# Patient Record
Sex: Female | Born: 1990 | Race: White | Hispanic: Yes | Marital: Single | Smoking: Current every day smoker
Health system: Southern US, Community
[De-identification: ages and names within clinical notes are randomized; demographics above are authoritative.]

## PROBLEM LIST (undated history)

## (undated) DIAGNOSIS — F1120 Opioid dependence, uncomplicated: Secondary | ICD-10-CM

## (undated) DIAGNOSIS — K219 Gastro-esophageal reflux disease without esophagitis: Secondary | ICD-10-CM

## (undated) DIAGNOSIS — Z87442 Personal history of urinary calculi: Secondary | ICD-10-CM

## (undated) HISTORY — PX: HX TONSILLECTOMY: SHX27

## (undated) HISTORY — PX: HX GALL BLADDER SURGERY/CHOLE: SHX55

## (undated) HISTORY — DX: Gastro-esophageal reflux disease without esophagitis: K21.9

## (undated) HISTORY — PX: WISDOM TOOTH EXTRACTION: SHX21

---
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
## 2012-05-27 ENCOUNTER — Encounter (HOSPITAL_COMMUNITY): Payer: Self-pay

## 2012-05-27 DIAGNOSIS — H6690 Otitis media, unspecified, unspecified ear: Secondary | ICD-10-CM | POA: Insufficient documentation

## 2012-05-27 DIAGNOSIS — F17200 Nicotine dependence, unspecified, uncomplicated: Secondary | ICD-10-CM | POA: Insufficient documentation

## 2012-05-27 DIAGNOSIS — J069 Acute upper respiratory infection, unspecified: Secondary | ICD-10-CM | POA: Insufficient documentation

## 2012-05-27 NOTE — ED Notes (Signed)
Pt also requesting to have a blood pregnancy test, pt reports taking several at home urine pregnancy test all w/a negative result, pt reports she received her last depo injection March 2013, denies menstrual cycle since stopping the Depo, pt reports she is sexually active, denies using any birth control

## 2012-05-27 NOTE — ED Notes (Signed)
Pt reports nasal/chest congestion, (L) ear pain, dry cough x4 days

## 2012-05-28 ENCOUNTER — Emergency Department (HOSPITAL_COMMUNITY)
Admission: EM | Admit: 2012-05-28 | Discharge: 2012-05-28 | Disposition: A | Discharge status: Home Health | Payer: Medicaid Other | Attending: Emergency Medicine | Admitting: Emergency Medicine

## 2012-05-28 DIAGNOSIS — J069 Acute upper respiratory infection, unspecified: Secondary | ICD-10-CM

## 2012-05-28 DIAGNOSIS — H6692 Otitis media, unspecified, left ear: Secondary | ICD-10-CM

## 2012-05-28 MED ORDER — AZITHROMYCIN 250 MG PO TABS: 250.0000 mg | ORAL_TABLET | Freq: Every day | ORAL | Status: AC

## 2012-05-28 MED ORDER — OXYMETAZOLINE HCL 0.05 % NA SOLN: 2.0000 | Freq: Two times a day (BID) | NASAL | Status: AC

## 2012-05-28 MED ORDER — AZITHROMYCIN 250 MG PO TABS
500.0000 mg | ORAL_TABLET | Freq: Once | ORAL | Status: AC
  Administered 2012-05-28: 500 mg via ORAL
  Filled 2012-05-28: qty 2

## 2012-05-28 MED ORDER — BENZONATATE 100 MG PO CAPS: 200.0000 mg | ORAL_CAPSULE | Freq: Two times a day (BID) | ORAL | Status: DC | PRN

## 2012-05-28 MED ORDER — BENZONATATE 100 MG PO CAPS: 100.0000 mg | ORAL_CAPSULE | Freq: Three times a day (TID) | ORAL | Status: AC

## 2012-05-28 MED ORDER — OXYMETAZOLINE HCL 0.05 % NA SOLN
1.0000 | Freq: Once | NASAL | Status: AC
  Administered 2012-05-28: 1 via NASAL
  Filled 2012-05-28: qty 15

## 2012-05-28 NOTE — ED Notes (Signed)
Pt and s.o. At bedside. Pt states she is feeling better. Instructions for nasal spray explained and verbalized by patient

## 2012-05-28 NOTE — ED Provider Notes (Signed)
History     CSN: [id number]  Arrival date & time 05/27/12  2254   First MD Initiated Contact with Patient 05/28/12 [phone]      Chief Complaint  Patient presents with  . Nasal Congestion    (Consider location/radiation/quality/duration/timing/severity/associated sxs/prior treatment) HPI 21 year old female presents to emergency room complaining of 4 days of nasal congestion, sinus pressure, dry cough, and left ear pain. She denies any fever. No sick contacts, she's been taking over-the-counter NyQuil without improvement in symptoms. She reports she's had some drainage and swelling of her left ear canal. She has not had any recent water exposure. No prior history of otitis externa. Patient is a 1 pack-a-day smoker.  History reviewed. No pertinent past medical history.  History reviewed. No pertinent past surgical history.  History reviewed. No pertinent family history.  History  Substance Use Topics  . Smoking status: Current Everyday Smoker -- 0.5 packs/day    Types: Cigarettes  . Smokeless tobacco: Not on file  . Alcohol Use: Yes     socially    OB History    Grav Para Term Preterm Abortions TAB SAB Ect Mult Living                  Review of Systems  All other systems reviewed and are negative.    Allergies  Amoxicillin and Penicillins  Home Medications   Current Outpatient Rx  Name Route Sig Dispense Refill  . IBUPROFEN 200 MG PO TABS Oral Take 800 mg by mouth every 6 (six) hours as needed. For pain    . AZITHROMYCIN 250 MG PO TABS Oral Take 1 tablet (250 mg total) by mouth daily. 4 tablet 0  . BENZONATATE 100 MG PO CAPS Oral Take 1 capsule (100 mg total) by mouth every 8 (eight) hours. 21 capsule 0  . OXYMETAZOLINE HCL 0.05 % NA SOLN Nasal Place 2 sprays into the nose 2 (two) times daily. Stop using after 3 days 30 mL 0    BP 121/87  Pulse 87  Temp 98.5 F (36.9 C) (Oral)  Resp 20  SpO2 100%  LMP 10/28/2011  Physical Exam  Nursing note and vitals  reviewed. Constitutional: She is oriented to person, place, and time. She appears well-developed and well-nourished.       Patient noted to have multiple piercings and tattoos  HENT:  Head: Normocephalic and atraumatic.  Right Ear: External ear normal.  Mouth/Throat: Oropharynx is clear and moist.       Left TM noted to be bulging, dull with serous exudate. Nasal congestion noted  Eyes: Conjunctivae and EOM are normal. Pupils are equal, round, and reactive to light.  Neck: Normal range of motion. Neck supple. No JVD present. No tracheal deviation present. No thyromegaly present.  Cardiovascular: Normal rate, regular rhythm, normal heart sounds and intact distal pulses.  Exam reveals no gallop and no friction rub.   No murmur heard. Pulmonary/Chest: Effort normal and breath sounds normal. No stridor. No respiratory distress. She has no wheezes. She has no rales. She exhibits no tenderness.  Abdominal: Soft. Bowel sounds are normal. She exhibits no distension and no mass. There is no tenderness. There is no rebound and no guarding.  Musculoskeletal: Normal range of motion. She exhibits no edema and no tenderness.  Lymphadenopathy:    She has no cervical adenopathy.  Neurological: She is alert and oriented to person, place, and time. She exhibits normal muscle tone. Coordination normal.  Skin: Skin is warm and dry. No  rash noted. No erythema. No pallor.  Psychiatric: She has a normal mood and affect. Her behavior is normal. Judgment and thought content normal.    ED Course  Procedures (including critical care time)  Labs Reviewed - No data to display No results found.   1. Otitis media of left ear   2. Upper respiratory infection       MDM  21 year old female with URI and left otitis. Patient reports bradycardia with amoxicillin and penicillin as a child. Will do azithromycin for treatment of her infected ear. He should also given symptomatic treatment for her URI  symptoms.        Olivia Mackie, MD 05/28/12 [phone]

## 2012-10-21 ENCOUNTER — Emergency Department: Payer: Self-pay | Admitting: Internal Medicine

## 2013-02-13 ENCOUNTER — Emergency Department: Payer: Self-pay | Admitting: Emergency Medicine

## 2013-02-13 LAB — DRUG SCREEN, URINE
Barbiturates, Ur Screen: NEGATIVE (ref ?–200)
Benzodiazepine, Ur Scrn: NEGATIVE (ref ?–200)
Cannabinoid 50 Ng, Ur ~~LOC~~: NEGATIVE (ref ?–50)
Cocaine Metabolite,Ur ~~LOC~~: NEGATIVE (ref ?–300)
MDMA (Ecstasy)Ur Screen: NEGATIVE (ref ?–500)
Methadone, Ur Screen: NEGATIVE (ref ?–300)
Opiate, Ur Screen: NEGATIVE (ref ?–300)
Tricyclic, Ur Screen: NEGATIVE (ref ?–1000)

## 2013-02-13 LAB — URINALYSIS, COMPLETE
Bacteria: NONE SEEN
Bilirubin,UR: NEGATIVE
Glucose,UR: NEGATIVE mg/dL (ref 0–75)
Ketone: NEGATIVE
Leukocyte Esterase: NEGATIVE
Nitrite: NEGATIVE
Ph: 7 (ref 4.5–8.0)
Protein: NEGATIVE
RBC,UR: 2 /HPF (ref 0–5)
Specific Gravity: 1.005 (ref 1.003–1.030)
Squamous Epithelial: 1

## 2013-02-13 LAB — CBC
HCT: 38.4 % (ref 35.0–47.0)
HGB: 13.1 g/dL (ref 12.0–16.0)
MCH: 31.2 pg (ref 26.0–34.0)
MCHC: 34 g/dL (ref 32.0–36.0)
Platelet: 232 10*3/uL (ref 150–440)
RDW: 13.2 % (ref 11.5–14.5)
WBC: 12.6 10*3/uL — ABNORMAL HIGH (ref 3.6–11.0)

## 2013-02-13 LAB — ETHANOL: Ethanol: <3 mg/dL

## 2013-02-13 LAB — COMPREHENSIVE METABOLIC PANEL
Albumin: 4.3 g/dL (ref 3.4–5.0)
Calcium, Total: 9.1 mg/dL (ref 8.5–10.1)
Co2: 26 mmol/L (ref 21–32)
EGFR (African American): >60
Glucose: 77 mg/dL (ref 65–99)
Osmolality: 274 (ref 275–301)
SGPT (ALT): 18 U/L (ref 12–78)
Sodium: 139 mmol/L (ref 136–145)
Total Protein: 7.9 g/dL (ref 6.4–8.2)

## 2013-02-14 LAB — HCG, QUANTITATIVE, PREGNANCY: Beta Hcg, Quant.: 724 m[IU]/mL — ABNORMAL HIGH

## 2013-02-14 LAB — PREGNANCY, URINE: Pregnancy Test, Urine: POSITIVE m[IU]/mL

## 2013-04-11 ENCOUNTER — Encounter: Payer: Self-pay | Admitting: Obstetrics & Gynecology

## 2013-05-16 ENCOUNTER — Encounter: Payer: Self-pay | Admitting: Obstetrics and Gynecology

## 2013-05-30 ENCOUNTER — Emergency Department (HOSPITAL_COMMUNITY): Payer: Medicaid Other

## 2013-05-30 ENCOUNTER — Emergency Department (HOSPITAL_COMMUNITY)
Admission: EM | Admit: 2013-05-30 | Discharge: 2013-05-30 | Disposition: A | Discharge status: Home / Self Care | Payer: Medicaid Other | Attending: Emergency Medicine | Admitting: Emergency Medicine

## 2013-05-30 ENCOUNTER — Encounter (HOSPITAL_COMMUNITY): Payer: Self-pay | Admitting: Emergency Medicine

## 2013-05-30 DIAGNOSIS — Y92410 Unspecified street and highway as the place of occurrence of the external cause: Secondary | ICD-10-CM | POA: Insufficient documentation

## 2013-05-30 DIAGNOSIS — O99330 Smoking (tobacco) complicating pregnancy, unspecified trimester: Secondary | ICD-10-CM | POA: Insufficient documentation

## 2013-05-30 DIAGNOSIS — O9989 Other specified diseases and conditions complicating pregnancy, childbirth and the puerperium: Secondary | ICD-10-CM | POA: Insufficient documentation

## 2013-05-30 DIAGNOSIS — Z880 Allergy status to penicillin: Secondary | ICD-10-CM | POA: Insufficient documentation

## 2013-05-30 DIAGNOSIS — Z79899 Other long term (current) drug therapy: Secondary | ICD-10-CM | POA: Insufficient documentation

## 2013-05-30 DIAGNOSIS — Y9389 Activity, other specified: Secondary | ICD-10-CM | POA: Insufficient documentation

## 2013-05-30 LAB — URINALYSIS, ROUTINE W REFLEX MICROSCOPIC
Glucose, UA: NEGATIVE mg/dL
Nitrite: NEGATIVE
Specific Gravity, Urine: 1.016 (ref 1.005–1.030)
pH: 8.5 — ABNORMAL HIGH (ref 5.0–8.0)

## 2013-05-30 LAB — URINE MICROSCOPIC-ADD ON
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
## 2013-05-30 NOTE — Progress Notes (Signed)
Talked with Dr. Debroah Loop. Pt cleared obstetrically.

## 2013-06-01 LAB — URINE CULTURE: Colony Count: >=100000

## 2013-06-02 ENCOUNTER — Encounter: Payer: Self-pay | Admitting: Obstetrics & Gynecology

## 2013-06-02 NOTE — ED Provider Notes (Signed)
Medical screening examination/treatment/procedure(s) were performed by non-physician practitioner and as supervising physician I was immediately available for consultation/collaboration.   Laray Anger, DO 06/02/13 [id number]

## 2013-09-01 ENCOUNTER — Encounter: Payer: Self-pay | Admitting: Maternal and Fetal Medicine

## 2013-09-02 ENCOUNTER — Observation Stay: Payer: Self-pay

## 2013-09-02 LAB — URINALYSIS, COMPLETE
Bilirubin,UR: NEGATIVE
Blood: NEGATIVE
Leukocyte Esterase: NEGATIVE
Nitrite: NEGATIVE
Ph: 7 (ref 4.5–8.0)
Specific Gravity: 1.015 (ref 1.003–1.030)
WBC UR: 1 /HPF (ref 0–5)

## 2013-10-07 ENCOUNTER — Observation Stay: Payer: Self-pay | Admitting: Obstetrics and Gynecology

## 2013-10-09 ENCOUNTER — Inpatient Hospital Stay: Payer: Self-pay

## 2013-10-09 LAB — CBC WITH DIFFERENTIAL/PLATELET
BASOS ABS: 0.2 10*3/uL — AB (ref 0.0–0.1)
BASOS PCT: 0.9 %
EOS PCT: 0.2 %
Eosinophil #: 0 10*3/uL (ref 0.0–0.7)
HCT: 35.2 % (ref 35.0–47.0)
HGB: 12.1 g/dL (ref 12.0–16.0)
Lymphocyte #: 2.2 10*3/uL (ref 1.0–3.6)
Lymphocyte %: 11 %
MCH: 32.5 pg (ref 26.0–34.0)
MCHC: 34.4 g/dL (ref 32.0–36.0)
MCV: 94 fL (ref 80–100)
Monocyte #: 1 x10 3/mm — ABNORMAL HIGH (ref 0.2–0.9)
Monocyte %: 5 %
NEUTROS PCT: 82.9 %
Neutrophil #: 16.3 10*3/uL — ABNORMAL HIGH (ref 1.4–6.5)
PLATELETS: 223 10*3/uL (ref 150–440)
RBC: 3.74 10*6/uL — ABNORMAL LOW (ref 3.80–5.20)
RDW: 13.8 % (ref 11.5–14.5)
WBC: 19.7 10*3/uL — ABNORMAL HIGH (ref 3.6–11.0)

## 2013-10-09 LAB — GC/CHLAMYDIA PROBE AMP

## 2013-10-10 LAB — URINALYSIS, COMPLETE
Bacteria: NONE SEEN
Bilirubin,UR: NEGATIVE
GLUCOSE, UR: NEGATIVE mg/dL (ref 0–75)
Ketone: NEGATIVE
Nitrite: NEGATIVE
Ph: 5 (ref 4.5–8.0)
RBC,UR: 195 /HPF (ref 0–5)
Specific Gravity: 1.026 (ref 1.003–1.030)
WBC UR: 49 /HPF (ref 0–5)

## 2013-10-10 LAB — HEMATOCRIT: HCT: 31.7 % — ABNORMAL LOW (ref 35.0–47.0)

## 2013-10-12 LAB — URINE CULTURE

## 2014-06-06 ENCOUNTER — Other Ambulatory Visit (HOSPITAL_COMMUNITY): Payer: Self-pay | Admitting: Emergency Medicine

## 2014-07-24 ENCOUNTER — Encounter (HOSPITAL_COMMUNITY): Payer: Self-pay | Admitting: Emergency Medicine
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
## 2015-01-13 NOTE — Consult Note (Signed)
Brief Consult Note: Diagnosis: Major depressive disorder recurrent moderate.   Patient was seen by consultant.   Consult note dictated.   Recommend further assessment or treatment.   Orders entered.   Comments: Selena Nelson has a h/o depression. She has been off medications throughout pregnancy. She is ready to restart antidepressant.   PLAN: 1. Will start Lexapro that was helpful in the past.  2. Pt was given information about [facility], local MH provider.  3. I will follow up tomorrow.  Electronic Signatures: Kristine LineaPucilowska, Sharnese Heath (MD)  (Signed 19-Jan-15 18:20)  Authored: Brief Consult Note   Last Updated: 19-Jan-15 18:20 by Kristine LineaPucilowska, Melinna Linarez (MD)

## 2015-01-13 NOTE — Consult Note (Signed)
Brief Consult Note: Diagnosis: Major depressive disorder recurrent moderate.   Patient was seen by consultant.   Consult note dictated.   Recommend further assessment or treatment.   Orders entered.   Comments: [name] has a h/o depression. She has been off medications throughout pregnancy. She was started on lexapro last night and seems to tolerate it well.   MSE: Alert, oriented, pleasant, no safety issues, no psychosis, father in the room will stay in the area for the next two weeks.    PLAN: 1. Will continue Lexapro 10 mg daily.   2. Pt was given information about [facility], local MH provider.  Electronic Signatures: Kristine LineaPucilowska, Cera Rorke (MD)  (Signed 20-Jan-15 21:54)  Authored: Brief Consult Note   Last Updated: 20-Jan-15 21:54 by Kristine LineaPucilowska, Cortasia Screws (MD)

## 2015-01-14 NOTE — Discharge Summary (Signed)
PATIENT NAME:  Selena QuailsCRISSEY [name], Maebell MR#:  [id number] DATE OF BIRTH:  [birth year]  DATE OF ADMISSION:  11/10/2011 DATE OF DISCHARGE:  11/11/2011  BRIEF HISTORY: Ms. Anson OregonVirola is a 24 year old girl seen in the Emergency Room with abdominal pain for about 8 to 12 hours prior to admission. She had been having mild nausea and anorexia for several days. Her symptoms worsened early on the morning of the 18th, and she presented to the Emergency Room for further evaluation. White blood cell count was unremarkable at 9200. Platelet count was down to 144,000. Electrolytes were unremarkable. Alkaline phosphatase was slightly depressed at 54. Ultrasound was performed which revealed a possible tubular structure in the right adnexa consistent with acute appendicitis, but CT scan with contrast did not demonstrate any evidence of inflammatory change or swollen appendix consistent with appendicitis. Her clinical presentation did not support appendicitis.   HOSPITAL COURSE: She was admitted to the hospital, and we placed her on IV antibiotics as she did have a history of urinary tract infection and IV rehydration. Symptoms improved over the last 24 hours. She is no longer having any significant abdominal pain, and her nausea is better. She is voiding spontaneously. We will discharge her home with Percocet for pain and to continue the previously prescribed antibiotics from [facility]. We will see her back in our office or have her [facility].   Of note is the fact that her white blood cell count is 3000 today, and her platelet count is down to 90,000. It is possible that this illness is somewhat related to her hematologic abnormalities, and we will need to follow up with a repeat laboratory value in 10 days or two weeks. I discussed this plan with her in detail and strongly encouraged her to follow up with her primary care doctor or with our office in order to review those  laboratory values. She is in agreement with this plan.  ____________________________ Carmie Endalph L. Ely III, MD rle:cbb D: 11/11/2011 12:05:19 ET T: 11/11/2011 13:05:21 ET JOB#: [id number]  cc: Carmie Endalph L. Ely III, MD, <Dictator> [facility] Ginette A. Andrey SpearmanArchinal, MD Quentin OreALPH L ELY MD ELECTRONICALLY SIGNED 11/12/2011 9:58

## 2015-01-14 NOTE — H&P (Signed)
PATIENT NAME:  Selena Nelson, Selena Nelson MR#:  [id number] DATE OF BIRTH:  December 09, 1990  DATE OF ADMISSION:  11/10/2011  PRIMARY CARE PHYSICIAN: [facility].   ADMITTING PHYSICIAN: Tiney Rougealph Ely, MD  CHIEF COMPLAINT: Abdominal pain.   BRIEF HISTORY: Selena Nelson is a 24 year old girl seen in the Emergency Room with about an 8 to 10 hour history of severe bilateral lower quadrant abdominal pain. She has been having increased urinary frequency and dysuria over the last several days and went to an urgent care center yesterday where she was diagnosed as having a urinary tract infection. She was given a prescription for antibiotics, but she did not get an opportunity to take those. She has had some anorexia and appetite changes, but no true nausea. She has had a couple episode of chills. She has had no fever that she is aware of. She denies any diarrhea or constipation. She awoke in the middle of the night feeling as though she had to void and then suffered severe bilateral lower quadrant abdominal pain which was unrelenting. She was transported to the Emergency Room by EMS and evaluated in the emergency department.   Work-up demonstrated normal laboratory values with normal white blood cell count. She did have evidence of urinary tract infection with white cells and red cells in her urine. Ultrasound was performed which revealed a vague tubular structure in the right lower quadrant and right pelvis suggestive of possible appendicitis. CT scan with contrast was performed which demonstrated a normal size appendix without evidence of any inflammatory stranding. There was some vague fluid in the right adnexa and in the right pelvis. The surgical service was consulted.   She denies any previous similar problems. She denies any other significant GI problems including hepatitis, yellow jaundice, pancreatitis, peptic ulcer disease, diverticulitis, or gallbladder disease. She denied any significant GYN problems, although as  a [age] year old she was told she had possible endometriosis. She has not had any recent abnormal bleeding. She denies any history of cardiac disease, hypertension, or diabetes.   MEDICATIONS: She takes no medications regularly.   ALLERGIES: Penicillin and amoxicillin with bradycardia as the symptom produced.   SOCIAL HISTORY: She is a cigarette smoker smoking approximately a pack of cigarettes a day and she drinks alcohol socially. She takes no illicit drugs.   FAMILY HISTORY: Noncontributory.   REVIEW OF SYSTEMS: No significant visual or hearing disturbances. She denies any headache. She denies any neck immobility or pain. She denies any shortness of breath or dyspnea on exertion. She denies any chest pain or heart rhythm disturbances. GU and GI symptoms are noted above. She denies any psychiatric symptoms. She denies any neurologic symptoms. She denies any musculoskeletal problems, including joint discomfort or pain.   PHYSICAL EXAMINATION:  GENERAL: She is an alert young woman a bit frustrated with the proceedings over the last several hours.  VITALS: Blood pressure is 110/72, heart rate 84 and regular, and she is afebrile.   HEENT: She has multiple facial piercings. She does not have any scleral icterus. Her pupils are equally round. She has no facial deformity.   NECK: Supple without adenopathy or tenderness, and her trachea is midline.   LUNGS: Chest is clear with no adventitious sounds and she has normal pulmonary excursion.   HEART: Normal sinus rhythm with no murmurs or gallops to my ear.   ABDOMEN: Generally soft with no point tenderness. She has bilateral lower quadrant tenderness on deep palpation, but no rebound and no guarding.  She has hypoactive but present bowel sounds. No hernias are noted. She has no masses or jiggle  GI  tenderness.   EXTREMITIES: Lower extremity exam reveals full range of motion, no deformities, and normal distal pulses.   PSYCHIATRIC: Normal  orientation and affect.   IMPRESSION AND PLAN: I have independently reviewed her CT scan. Clinical examination does not suggest appendicitis. I do not have a specific diagnosis at the present time. I recommend we admit her to the hospital under observation because of her significant pain, treat her with pain medications, start her on antibiotics for the urinary tract infection, and observe her with serial examinations. This plan has been discussed with the patient and her family and they are in agreement.  ____________________________ Carmie End, MD rle:slb D: 11/10/2011 12:05:44 ET T: 11/10/2011 12:30:41 ET JOB#: [id number]  cc: Carmie End, MD, <Dictator> [facility] Quentin Ore MD ELECTRONICALLY SIGNED 11/10/2011 17:30

## 2015-01-30 NOTE — H&P (Signed)
L&D Evaluation:  History:  HPI 24yo G1P0 with LMP ?4/14 & EDD of 10/21/13 [facility] significant for Depression, smoker 1 ppd now 4 cigs a day, RH neg, Varicella non-immune, Bilat choroid plexus cysts. Pt [facility] with active labor UC's. AROM performed at 0933   for clear fluid. FHR reassuring.   Presents with contractions   Patient's Medical History UTI,inflamed appendix,   Patient's Surgical History Tonsillectomy   Medications Pre Natal Vitamins   Allergies Kiwi causes tongue to swell, PCN causes SOB, Abilify causes neuro problem   Social History tobacco  EtOH  Last ETOH mid 01/2013 x 2 yrs   Family History Non-Contributory   ROS:  ROS All systems were reviewed.  HEENT, CNS, GI, GU, Respiratory, CV, Renal and Musculoskeletal systems were found to be normal.   Exam:  Vital Signs stable   General no apparent distress   Mental Status clear   Chest clear   Heart normal sinus rhythm, no murmur/gallop/rubs   Abdomen gravid, tender with contractions   Estimated Fetal Weight Average for gestational age   Fetal Position vtx   Back no CVAT   Edema 1+   Reflexes 1+   Pelvic 6-7/100/vtx0   Mebranes Intact   Description clear   FHT normal rate with no decels, reactive NST with 2 accels 15 x 15   Ucx regular   Skin dry   Lymph no lymphadenopathy   Impression:  Impression active labor, IUP at term with active labor   Plan:  Plan monitor contractions and for cervical change   Electronic Signatures: Sharee PimpleJones, Caron W (CNM)  (Signed 18-Jan-15 10:09)  Authored: L&D Evaluation   Last Updated: 18-Jan-15 10:09 by Sharee PimpleJones, Caron W (CNM)

## 2015-01-30 NOTE — H&P (Signed)
L&D Evaluation:  History:  HPI 22 yoG1P0 with LMP of ?4/14 and EDd of 10/21/13 with [facility] at [facility] signficant for Depression [facility], smoker 1ppd down to 4 cigs a day, RH negative, Varicella non-immune, Bialt plexus cysts ([facility] and [facility] with no further f/u needed). Pt arrived today with "?LOF" some UC's. No VB or decreased FM.No fluid seen   Presents with contractions, leaking fluid   Patient's Medical History Depression   Patient's Surgical History none   Medications Pre Natal Vitamins   Allergies PCN, Abilify, Kiwi,   Social History tobacco  1 ppd down to 4 cigs a day   Family History Non-Contributory   ROS:  ROS All systems were reviewed.  HEENT, CNS, GI, GU, Respiratory, CV, Renal and Musculoskeletal systems were found to be normal.   Exam:  Vital Signs stable  BP 121/70   General no apparent distress   Mental Status clear   Chest clear   Heart normal sinus rhythm, no murmur/gallop/rubs   Abdomen gravid, non-tender   Estimated Fetal Weight Average for gestational age   Fetal Position vtx   Back no CVAT   Edema 1+   Reflexes 1+   Clonus negative   Pelvic 2/90/vtx ballotable   Mebranes Intact   FHT normal rate with no decels, reactive NST with 2 accels 15 x 15 bpm   Ucx regular, q 6 to 7 mins, pt cannot feel them.   Skin dry   Lymph no lymphadenopathy   Impression:  Impression IUP at 38 weeks for labor eval   Plan:  Plan EFM/NST, monitor contractions and for cervical change, Spec exam, no fluid with cough, neg fern   Comments Advised that pt may be in early labor pattern and may go home and rest. Pt may return here for active labor. Pt is GBs unknown. NO LOF noted.   Electronic Signatures: Sharee PimpleJones, Meriem Lemieux W (CNM)  (Signed 16-Jan-15 19:03)  Authored: L&D Evaluation   Last Updated: 16-Jan-15 19:03 by Sharee PimpleJones, Dontray Haberland W (CNM)

## 2016-01-21 ENCOUNTER — Encounter: Payer: Self-pay | Admitting: *Deleted

## 2016-01-21 ENCOUNTER — Emergency Department
Admission: EM | Admit: 2016-01-21 | Discharge: 2016-01-22 | Disposition: A | Discharge status: Home / Self Care | Payer: No Typology Code available for payment source | Attending: Emergency Medicine | Admitting: Emergency Medicine

## 2016-01-21 DIAGNOSIS — F17210 Nicotine dependence, cigarettes, uncomplicated: Secondary | ICD-10-CM | POA: Insufficient documentation

## 2016-01-21 DIAGNOSIS — T401X1A Poisoning by heroin, accidental (unintentional), initial encounter: Secondary | ICD-10-CM | POA: Insufficient documentation

## 2016-01-21 DIAGNOSIS — F1110 Opioid abuse, uncomplicated: Secondary | ICD-10-CM

## 2016-01-21 DIAGNOSIS — F1919 Other psychoactive substance abuse with unspecified psychoactive substance-induced disorder: Secondary | ICD-10-CM | POA: Insufficient documentation

## 2016-01-21 NOTE — ED Notes (Signed)
Pt to ED seeking opioid detox. Pt states that she has been doing heroin and pills for about 1 year, with the last time using being today 4hrs ago (.5g of heroin). Pt is AOx4 in no apparent distress with no symptoms.

## 2016-01-21 NOTE — ED Provider Notes (Signed)
[facility] Emergency Department Provider Note   ____________________________________________  Time seen: Approximately 11:20 PM  I have reviewed the triage vital signs and the nursing notes.   HISTORY  Chief Complaint Drug / Alcohol Assessment    HPI Selena Nelson is a 25 y.o. female who presents to the ED from home seeking opioid detox. Patient reports she moved from [place] 2 [place] ago. States it was "easier to score drugs" in [place] that it is here. Before she was using Opana bars; now she has resorted to heroin use. Last use approximately 2 hours ago.Denies cocaine use. Denies SI/HI/AH/VH. Complains of feeling restless. Denies associated symptoms of fever, chills, chest pain, shortness of breath, abdominal pain, nausea, vomiting, diarrhea. Nothing makes her symptoms better or worse.   Past medical history Substance abuse  There are no active problems to display for this patient.   No past surgical history on file.  No current outpatient prescriptions on file.  Allergies Amoxicillin and Penicillins  No family history on file.  Social History Social History  Substance Use Topics  . Smoking status: Current Every Day Smoker -- 0.50 packs/day    Types: Cigarettes  . Smokeless tobacco: None  . Alcohol Use: No     Comment: socially    Review of Systems  Constitutional: No fever/chills. Eyes: No visual changes. ENT: No sore throat. Cardiovascular: Denies chest pain. Respiratory: Denies shortness of breath. Gastrointestinal: No abdominal pain.  No nausea, no vomiting.  No diarrhea.  No constipation. Genitourinary: Negative for dysuria. Musculoskeletal: Negative for back pain. Skin: Negative for rash. Neurological: Negative for headaches, focal weakness or numbness. Psychiatric:Positive for substance use.  10-point ROS otherwise negative.  ____________________________________________   PHYSICAL EXAM:  VITAL  SIGNS: ED Triage Vitals  Enc Vitals Group     BP 01/21/16 2220 134/84 mmHg     Pulse Rate 01/21/16 2220 71     Resp --      Temp 01/21/16 2220 98.5 F (36.9 C)     Temp Source 01/21/16 2220 Oral     SpO2 01/21/16 2220 99 %     Weight 01/21/16 2220 140 lb (63.504 kg)     Height 01/21/16 2220 5\' 2"  (1.575 m)     Head Cir --      Peak Flow --      Pain Score --      Pain Loc --      Pain Edu? --      Excl. in GC? --     Constitutional: Alert and oriented. Well appearing and in no acute distress. Eyes: Conjunctivae are normal. PERRL. EOMI. Head: Atraumatic. Nose: No congestion/rhinnorhea. Mouth/Throat: Mucous membranes are moist.  Oropharynx non-erythematous. Neck: No stridor.   Cardiovascular: Normal rate, regular rhythm. Grossly normal heart sounds.  Good peripheral circulation. Respiratory: Normal respiratory effort.  No retractions. Lungs CTAB. Gastrointestinal: Soft and nontender. No distention. No abdominal bruits. No CVA tenderness. Musculoskeletal: No lower extremity tenderness nor edema.  No joint effusions. Neurologic:  Normal speech and language. No gross focal neurologic deficits are appreciated. No gait instability. Skin:  Skin is warm, dry and intact. No rash noted. Psychiatric: Mood and affect are normal. Speech and behavior are normal.  ____________________________________________   LABS (all labs ordered are listed, but only abnormal results are displayed)  Labs Reviewed  COMPREHENSIVE METABOLIC PANEL - Abnormal; Notable for the following:    CO2 20 (*)    Total Protein 8.5 (*)  ALT 12 (*)    All other components within normal limits  ACETAMINOPHEN LEVEL - Abnormal; Notable for the following:    Acetaminophen (Tylenol), Serum <10 (*)    All other components within normal limits  ETHANOL - Abnormal; Notable for the following:    Alcohol, Ethyl (B) 57 (*)    All other components within normal limits  URINALYSIS COMPLETEWITH MICROSCOPIC ([facility] ONLY) -  Abnormal; Notable for the following:    Color, Urine STRAW (*)    APPearance CLEAR (*)    Specific Gravity, Urine 1.003 (*)    Hgb urine dipstick 1+ (*)    Squamous Epithelial / LPF 0-5 (*)    All other components within normal limits  URINE DRUG SCREEN, QUALITATIVE ([facility] ONLY) - Abnormal; Notable for the following:    Opiate, Ur Screen POSITIVE (*)    All other components within normal limits  CBC WITH DIFFERENTIAL/PLATELET  SALICYLATE LEVEL  POC URINE PREG, ED  POCT PREGNANCY, URINE   ____________________________________________  EKG  None ____________________________________________  RADIOLOGY  None ____________________________________________   PROCEDURES  Procedure(s) performed: None  Critical Care performed: No  ____________________________________________   INITIAL IMPRESSION / ASSESSMENT AND PLAN / ED COURSE  Pertinent labs & imaging results that were available during my care of the patient were reviewed by me and considered in my medical decision making (see chart for details).  25 year old female seeking opioid detox. She does not exhibit SI/HI. She will be given local resources for outpatient follow-up by TTS and a clonidine 0.1 mg PO while in the emergency department. Strict return precautions given. Patient verbalizes understanding and agrees with plan of care. ____________________________________________   FINAL CLINICAL IMPRESSION(S) / ED DIAGNOSES  Final diagnoses:  Opioid abuse      NEW MEDICATIONS STARTED DURING THIS VISIT:  New Prescriptions   No medications on file     Note:  This document was prepared using Dragon voice recognition software and may include unintentional dictation errors.    Irean Hong, MD 01/22/16 [phone]

## 2016-01-21 NOTE — ED Notes (Signed)
Pt requesting alcohol and drug detox.  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  Last drink was tonight.  Pt tearful.  Denies SI or HI.  Pt cooperative.

## 2016-01-22 LAB — CBC WITH DIFFERENTIAL/PLATELET
BASOS ABS: 0.1 10*3/uL (ref 0–0.1)
BASOS PCT: 1 %
EOS ABS: 0.3 10*3/uL (ref 0–0.7)
Eosinophils Relative: 3 %
HCT: 41.8 % (ref 35.0–47.0)
HEMOGLOBIN: 14.4 g/dL (ref 12.0–16.0)
Lymphocytes Relative: 41 %
Lymphs Abs: 3.6 10*3/uL (ref 1.0–3.6)
MCH: 31.3 pg (ref 26.0–34.0)
MCHC: 34.4 g/dL (ref 32.0–36.0)
MCV: 91 fL (ref 80.0–100.0)
MONOS PCT: 6 %
Monocytes Absolute: 0.5 10*3/uL (ref 0.2–0.9)
NEUTROS PCT: 49 %
Neutro Abs: 4.3 10*3/uL (ref 1.4–6.5)
Platelets: 245 10*3/uL (ref 150–440)
RBC: 4.59 MIL/uL (ref 3.80–5.20)
RDW: 13.1 % (ref 11.5–14.5)
WBC: 8.9 10*3/uL (ref 3.6–11.0)

## 2016-01-22 LAB — COMPREHENSIVE METABOLIC PANEL
ALBUMIN: 4.8 g/dL (ref 3.5–5.0)
ALT: 12 U/L — ABNORMAL LOW (ref 14–54)
ANION GAP: 14 (ref 5–15)
AST: 16 U/L (ref 15–41)
Alkaline Phosphatase: 62 U/L (ref 38–126)
BUN: 6 mg/dL (ref 6–20)
CALCIUM: 9.6 mg/dL (ref 8.9–10.3)
CO2: 20 mmol/L — AB (ref 22–32)
Chloride: 107 mmol/L (ref 101–111)
Creatinine, Ser: 0.66 mg/dL (ref 0.44–1.00)
GFR calc Af Amer: >60 mL/min (ref >60)
GFR calc non Af Amer: >60 mL/min (ref >60)
GLUCOSE: 86 mg/dL (ref 65–99)
POTASSIUM: 3.5 mmol/L (ref 3.5–5.1)
SODIUM: 141 mmol/L (ref 135–145)
Total Bilirubin: 0.4 mg/dL (ref 0.3–1.2)
Total Protein: 8.5 g/dL — ABNORMAL HIGH (ref 6.5–8.1)

## 2016-01-22 LAB — URINE DRUG SCREEN, QUALITATIVE (ARMC ONLY)
Amphetamines, Ur Screen: NOT DETECTED
BARBITURATES, UR SCREEN: NOT DETECTED
BENZODIAZEPINE, UR SCRN: NOT DETECTED
CANNABINOID 50 NG, UR ~~LOC~~: NOT DETECTED
Cocaine Metabolite,Ur ~~LOC~~: NOT DETECTED
MDMA (Ecstasy)Ur Screen: NOT DETECTED
Methadone Scn, Ur: NOT DETECTED
Opiate, Ur Screen: POSITIVE — AB
PHENCYCLIDINE (PCP) UR S: NOT DETECTED
Tricyclic, Ur Screen: NOT DETECTED

## 2016-01-22 LAB — URINALYSIS COMPLETE WITH MICROSCOPIC (ARMC ONLY)
Bacteria, UA: NONE SEEN
Bilirubin Urine: NEGATIVE
Glucose, UA: NEGATIVE mg/dL
KETONES UR: NEGATIVE mg/dL
LEUKOCYTES UA: NEGATIVE
Nitrite: NEGATIVE
PH: 6 (ref 5.0–8.0)
PROTEIN: NEGATIVE mg/dL
SPECIFIC GRAVITY, URINE: 1.003 — AB (ref 1.005–1.030)

## 2016-01-22 LAB — SALICYLATE LEVEL: Salicylate Lvl: <4 mg/dL (ref 2.8–30.0)

## 2016-01-22 LAB — ACETAMINOPHEN LEVEL: Acetaminophen (Tylenol), Serum: <10 ug/mL — ABNORMAL LOW (ref 10–30)

## 2016-01-22 LAB — POCT PREGNANCY, URINE: PREG TEST UR: NEGATIVE

## 2016-01-22 LAB — ETHANOL: ALCOHOL ETHYL (B): 57 mg/dL — AB (ref <5)

## 2016-01-22 MED ORDER — CLONIDINE HCL 0.1 MG PO TABS
0.1000 mg | ORAL_TABLET | Freq: Once | ORAL | Status: AC
  Administered 2016-01-22: 0.1 mg via ORAL
  Filled 2016-01-22: qty 1

## 2016-01-22 NOTE — Discharge Instructions (Signed)
You have been provided with a list of resources for outpatient detox; please call one of the facilities to schedule an appointment. Return to the ER for worsening symptoms, persistent vomiting, difficulty breathing, feelings of hurting yourself or others, or other concerns.  Opioid Use Disorder Opioid use disorder is a mental disorder. It is the continued nonmedical use of opioids in spite of risks to health and well-being. Misused opioids include the street drug heroin. They also include pain medicines such as morphine, hydrocodone, oxycodone, and fentanyl. Opioids are very addictive. People who misuse opioids get an exaggerated feeling of well-being. Opioid use disorder often disrupts activities at home, work, or school. It may cause mental or physical problems.  A family history of opioid use disorder puts you at higher risk of it. People with opioid use disorder often misuse other drugs or have mental illness such as depression, posttraumatic stress disorder, or antisocial personality disorder. They also are at risk of suicide and death from overdose. SIGNS AND SYMPTOMS  Signs and symptoms of opioid use disorder include:  Use of opioids in larger amounts or over a longer period than intended.  Unsuccessful attempts to cut down or control opioid use.  A lot of time spent obtaining, using, or recovering from the effects of opioids.  A strong desire or urge to use opioids (craving).  Continued use of opioids in spite of major problems at work, school, or home because of use.  Continued use of opioids in spite of relationship problems because of use.  Giving up or cutting down on important life activities because of opioid use.  Use of opioids over and over in situations when it is physically hazardous, such as driving a car.  Continued use of opioids in spite of a physical problem that is likely related to use. Physical problems can include:  Severe constipation.  Poor  nutrition.  Infertility.  Tuberculosis.  Aspiration pneumonia.  Infections such as human immunodeficiency virus (HIV) and hepatitis (from injecting opioids).  Continued use of opioids in spite of a mental problem that is likely related to use. Mental problems can include:  Depression.  Anxiety.  Hallucinations.  Sleep problems.  Loss of sexual function.  Need to use more and more opioids to get the same effect, or lessened effect over time with use of the same amount (tolerance).  Having withdrawal symptoms when opioid use is stopped, or using opioids to reduce or avoid withdrawal symptoms. Withdrawal symptoms include:  Depressed, anxious, or irritable mood.  Nausea, vomiting, diarrhea, or intestinal cramping.  Muscle aches or spasms.  Excessive tearing or runny nose.  Dilated pupils, sweating, or hairs standing on end.  Yawning.  Fever, raised blood pressure, or fast pulse.  Restlessness or trouble sleeping. This does not apply to people taking opioids for medical reasons only. DIAGNOSIS Opioid use disorder is diagnosed by your health care provider. You may be asked questions about your opioid use and and how it affects your life. A physical exam may be done. A drug screen may be ordered. You may be referred to a mental health professional. The diagnosis of opioid use disorder requires at least two symptoms within 12 months. The type of opioid use disorder you have depends on the number of signs and symptoms you have. The type may be:  Mild. Two or three signs and symptoms.   Moderate. Four or five signs and symptoms.   Severe. Six or more signs and symptoms. TREATMENT  Treatment is usually provided by  mental health professionals with training in substance use disorders.The following options are available:  Detoxification.This is the first step in treatment for withdrawal. It is medically supervised withdrawal with the use of medicines. These medicines lessen  withdrawal symptoms. They also raise the chance of becoming opioid free.  Counseling, also known as talk therapy. Talk therapy addresses the reasons you use opioids. It also addresses ways to keep you from using again (relapse). The goals of talk therapy are to avoid relapse by:  Identifying and avoiding triggers for use.  Finding healthy ways to cope with stress.  Learning how to handle cravings.  Support groups. Support groups provide emotional support, advice, and guidance.  A medicine that blocks opioid receptors in your brain. This medicine can reduce opioid cravings that lead to relapse. This medicine also blocks the desired opioid effect when relapse occurs.  Opioids that are taken by mouth in place of the misused opioid (opioid maintenance treatment). These medicines satisfy cravings but are safer than commonly misused opioids. This often is the best option for people who continue to relapse with other treatments. HOME CARE INSTRUCTIONS   Take medicines only as directed by your health care provider.  Check with your health care provider before starting new medicines.  Keep all follow-up visits as directed by your health care provider. SEEK MEDICAL CARE IF:  You are not able to take your medicines as directed.  Your symptoms get worse. SEEK IMMEDIATE MEDICAL CARE IF:  You have serious thoughts about hurting yourself or others.  You may have taken an overdose of opioids. FOR MORE INFORMATION  National Institute on Drug Abuse: http://www.price-smith.com/www.drugabuse.gov  Substance Abuse and Mental Health Services Administration: SkateOasis.com.ptwww.samhsa.gov   This information is not intended to replace advice given to you by your health care provider. Make sure you discuss any questions you have with your health care provider.   Document Released: 07/06/2007 Document Revised: 09/29/2014 Document Reviewed: 09/21/2013 Elsevier Interactive Patient Education [employer]2016 Elsevier Inc.

## 2016-08-13 ENCOUNTER — Encounter: Payer: Self-pay | Admitting: Emergency Medicine

## 2016-08-13 ENCOUNTER — Emergency Department
Admission: EM | Admit: 2016-08-13 | Discharge: 2016-08-13 | Disposition: A | Discharge status: Home / Self Care | Payer: Medicaid Other | Attending: Emergency Medicine | Admitting: Emergency Medicine

## 2016-08-13 DIAGNOSIS — F17210 Nicotine dependence, cigarettes, uncomplicated: Secondary | ICD-10-CM | POA: Insufficient documentation

## 2016-08-13 DIAGNOSIS — J40 Bronchitis, not specified as acute or chronic: Secondary | ICD-10-CM | POA: Insufficient documentation

## 2016-08-13 DIAGNOSIS — Z880 Allergy status to penicillin: Secondary | ICD-10-CM | POA: Insufficient documentation

## 2016-08-13 DIAGNOSIS — T63481A Toxic effect of venom of other arthropod, accidental (unintentional), initial encounter: Secondary | ICD-10-CM

## 2016-08-13 DIAGNOSIS — T6391XA Toxic effect of contact with unspecified venomous animal, accidental (unintentional), initial encounter: Secondary | ICD-10-CM | POA: Insufficient documentation

## 2016-08-13 DIAGNOSIS — W57XXXA Bitten or stung by nonvenomous insect and other nonvenomous arthropods, initial encounter: Secondary | ICD-10-CM

## 2016-08-13 MED ORDER — PROMETHAZINE-DM 6.25-15 MG/5ML PO SYRP: 5.0000 mL | ORAL_SOLUTION | Freq: Four times a day (QID) | ORAL | 0 refills | Status: DC | PRN

## 2016-08-13 MED ORDER — HYDROCORTISONE 1 % EX OINT: 1.0000 "application " | TOPICAL_OINTMENT | Freq: Two times a day (BID) | CUTANEOUS | 0 refills | Status: DC

## 2016-08-13 MED ORDER — AZITHROMYCIN 250 MG PO TABS: ORAL_TABLET | ORAL | 0 refills | Status: AC

## 2016-08-13 NOTE — ED Triage Notes (Signed)
Pt states she has had vomiting, congestion and coughing for about 3 days. Pt also states she woke this morning with a rash on her left side. Pt states her cough is productive and that her "mucus is greeen".

## 2016-08-13 NOTE — ED Notes (Signed)
Pt discharged home after verbalizing understanding of discharge instructions; nad noted. 

## 2016-08-13 NOTE — ED Provider Notes (Signed)
[facility] Emergency Department Provider Note   ____________________________________________   First MD Initiated Contact with Patient 08/13/16 1308     (approximate)  I have reviewed the triage vital signs and the nursing notes.   HISTORY  Chief Complaint No chief complaint on file.    HPI Sherin Quarrynn Crissey [place] is a 25 y.o. female patient complain of nasal congestion and coughing nausea and vomiting for 3 days. Patient states she will smoke a rash on her left side. Patient state her cough is productive and greenish in color.Patient states the rash is itching. No palliative measures for his complaint. Patient is positive tobacco user. Patient denies pain with this complaint.   History reviewed. No pertinent past medical history.  There are no active problems to display for this patient.   History reviewed. No pertinent surgical history.  Prior to Admission medications   Medication Sig Start Date End Date Taking? Authorizing Provider  azithromycin (ZITHROMAX Z-PAK) 250 MG tablet Take 2 tablets (500 mg) on  Day 1,  followed by 1 tablet (250 mg) once daily on Days 2 through 5. 08/13/16 08/18/16  Joni Reiningonald K Taft Worthing, PA-C  hydrocortisone 1 % ointment Apply 1 application topically 2 (two) times daily. 08/13/16   Joni Reiningonald K Dorthey Depace, PA-C  promethazine-dextromethorphan (PROMETHAZINE-DM) 6.25-15 MG/5ML syrup Take 5 mLs by mouth 4 (four) times daily as needed for cough. 08/13/16   Joni Reiningonald K Adith Tejada, PA-C    Allergies Amoxicillin and Penicillins  History reviewed. No pertinent family history.  Social History Social History  Substance Use Topics  . Smoking status: Current Every Day Smoker    Packs/day: 0.50    Types: Cigarettes  . Smokeless tobacco: Never Used  . Alcohol use No     Comment: socially    Review of Systems Constitutional: No fever/chills Eyes: No visual changes. ENT: No sore throat.Nasal congestion Cardiovascular: Denies chest  pain. Respiratory: Denies shortness of breath. Gastrointestinal: No abdominal pain.  Nausea and vomiting.  No diarrhea.  No constipation. Genitourinary: Negative for dysuria. Musculoskeletal: Negative for back pain. Skin: Positive for rash. Neurological: Negative for headaches, focal weakness or numbness. Allergic/Immunilogical: Penicillin  ____________________________________________   PHYSICAL EXAM:  VITAL SIGNS: ED Triage Vitals  Enc Vitals Group     BP      Pulse      Resp      Temp      Temp src      SpO2      Weight      Height      Head Circumference      Peak Flow      Pain Score      Pain Loc      Pain Edu?      Excl. in GC?     Constitutional: Alert and oriented. Well appearing and in no acute distress. Eyes: Conjunctivae are normal. PERRL. EOMI. Head: Atraumatic. Nose: Edematous nasal turbinates. Bilateral maxillary guarding. Mouth/Throat: Mucous membranes are moist.  Oropharynx non-erythematous. Neck: No stridor.  No cervical spine tenderness to palpation. Hematological/Lymphatic/Immunilogical: No cervical lymphadenopathy. Cardiovascular: Normal rate, regular rhythm. Grossly normal heart sounds.  Good peripheral circulation. Respiratory: Normal respiratory effort.  No retractions. Lungs lateral Rales. Gastrointestinal: Soft and nontender. No distention. No abdominal bruits. No CVA tenderness. Musculoskeletal: No lower extremity tenderness nor edema.  No joint effusions. Neurologic:  Normal speech and language. No gross focal neurologic deficits are appreciated. No gait instability. Skin:  Skin is warm, dry and intact.3 macular lesion lateral  aspect of back. Psychiatric: Mood and affect are normal. Speech and behavior are normal.  ____________________________________________   LABS (all labs ordered are listed, but only abnormal results are displayed)  Labs Reviewed - No data to  display ____________________________________________  EKG   ____________________________________________  RADIOLOGY   ____________________________________________   PROCEDURES  Procedure(s) performed: None  Procedures  Critical Care performed: No  ____________________________________________   INITIAL IMPRESSION / ASSESSMENT AND PLAN / ED COURSE  Pertinent labs & imaging results that were available during my care of the patient were reviewed by me and considered in my medical decision making (see chart for details).  Bronchitis an insect bite. Patient given discharge care instruction. Discuss tobacco cessation. Patient given a prescription for Phenergan DM, Zithromax, hydrocortisone. Patient advised follow-up family doctor. Clinical Course      ____________________________________________   FINAL CLINICAL IMPRESSION(S) / ED DIAGNOSES  Final diagnoses:  Bronchitis  Insect bites and stings, accidental or unintentional, initial encounter      NEW MEDICATIONS STARTED DURING THIS VISIT:  New Prescriptions   AZITHROMYCIN (ZITHROMAX Z-PAK) 250 MG TABLET    Take 2 tablets (500 mg) on  Day 1,  followed by 1 tablet (250 mg) once daily on Days 2 through 5.   HYDROCORTISONE 1 % OINTMENT    Apply 1 application topically 2 (two) times daily.   PROMETHAZINE-DEXTROMETHORPHAN (PROMETHAZINE-DM) 6.25-15 MG/5ML SYRUP    Take 5 mLs by mouth 4 (four) times daily as needed for cough.     Note:  This document was prepared using Dragon voice recognition software and may include unintentional dictation errors.    Joni Reiningonald K Graylin Sperling, PA-C 08/13/16 1334    Jennye MoccasinBrian S Quigley, MD 08/13/16 1409

## 2016-09-03 ENCOUNTER — Emergency Department: Payer: Self-pay

## 2016-09-03 ENCOUNTER — Emergency Department
Admission: EM | Admit: 2016-09-03 | Discharge: 2016-09-03 | Disposition: A | Discharge status: Home / Self Care | Payer: Self-pay | Attending: Emergency Medicine | Admitting: Emergency Medicine

## 2016-09-03 ENCOUNTER — Encounter: Payer: Self-pay | Admitting: Emergency Medicine

## 2016-09-03 DIAGNOSIS — R21 Rash and other nonspecific skin eruption: Secondary | ICD-10-CM | POA: Insufficient documentation

## 2016-09-03 DIAGNOSIS — N939 Abnormal uterine and vaginal bleeding, unspecified: Secondary | ICD-10-CM

## 2016-09-03 DIAGNOSIS — R197 Diarrhea, unspecified: Secondary | ICD-10-CM

## 2016-09-03 DIAGNOSIS — R112 Nausea with vomiting, unspecified: Secondary | ICD-10-CM

## 2016-09-03 DIAGNOSIS — F17210 Nicotine dependence, cigarettes, uncomplicated: Secondary | ICD-10-CM | POA: Insufficient documentation

## 2016-09-03 DIAGNOSIS — A0811 Acute gastroenteropathy due to Norwalk agent: Secondary | ICD-10-CM | POA: Insufficient documentation

## 2016-09-03 LAB — URINALYSIS, COMPLETE (UACMP) WITH MICROSCOPIC
BILIRUBIN URINE: NEGATIVE
Glucose, UA: NEGATIVE mg/dL
KETONES UR: 5 mg/dL — AB
Nitrite: NEGATIVE
PH: 5 (ref 5.0–8.0)
Protein, ur: NEGATIVE mg/dL
Specific Gravity, Urine: 1.028 (ref 1.005–1.030)

## 2016-09-03 LAB — COMPREHENSIVE METABOLIC PANEL
ALT: 13 U/L — ABNORMAL LOW (ref 14–54)
AST: 19 U/L (ref 15–41)
Albumin: 4.5 g/dL (ref 3.5–5.0)
Alkaline Phosphatase: 58 U/L (ref 38–126)
Anion gap: 7 (ref 5–15)
BUN: 13 mg/dL (ref 6–20)
CHLORIDE: 103 mmol/L (ref 101–111)
CO2: 24 mmol/L (ref 22–32)
Calcium: 9.5 mg/dL (ref 8.9–10.3)
Creatinine, Ser: 0.6 mg/dL (ref 0.44–1.00)
Glucose, Bld: 96 mg/dL (ref 65–99)
POTASSIUM: 3.7 mmol/L (ref 3.5–5.1)
Sodium: 134 mmol/L — ABNORMAL LOW (ref 135–145)
Total Bilirubin: 0.9 mg/dL (ref 0.3–1.2)
Total Protein: 8.2 g/dL — ABNORMAL HIGH (ref 6.5–8.1)

## 2016-09-03 LAB — CBC
HEMATOCRIT: 40.3 % (ref 35.0–47.0)
HEMOGLOBIN: 13.9 g/dL (ref 12.0–16.0)
MCH: 32.2 pg (ref 26.0–34.0)
MCHC: 34.5 g/dL (ref 32.0–36.0)
MCV: 93.4 fL (ref 80.0–100.0)
Platelets: 204 10*3/uL (ref 150–440)
RBC: 4.31 MIL/uL (ref 3.80–5.20)
RDW: 13 % (ref 11.5–14.5)
WBC: 7.9 10*3/uL (ref 3.6–11.0)

## 2016-09-03 LAB — GASTROINTESTINAL PANEL BY PCR, STOOL (REPLACES STOOL CULTURE)

## 2016-09-03 LAB — C DIFFICILE QUICK SCREEN W PCR REFLEX
C DIFFICILE (CDIFF) TOXIN: NEGATIVE
C Diff antigen: NEGATIVE
C Diff interpretation: NOT DETECTED

## 2016-09-03 LAB — POCT PREGNANCY, URINE: PREG TEST UR: NEGATIVE

## 2016-09-03 LAB — WET PREP, GENITAL
CLUE CELLS WET PREP: NONE SEEN
SPERM: NONE SEEN
TRICH WET PREP: NONE SEEN
YEAST WET PREP: NONE SEEN

## 2016-09-03 LAB — CHLAMYDIA/NGC RT PCR (ARMC ONLY)
Chlamydia Tr: NOT DETECTED
N gonorrhoeae: NOT DETECTED

## 2016-09-03 LAB — LIPASE, BLOOD: LIPASE: 15 U/L (ref 11–51)

## 2016-09-03 MED ORDER — ONDANSETRON 4 MG PO TBDP: 4.0000 mg | ORAL_TABLET | Freq: Three times a day (TID) | ORAL | 0 refills | Status: DC | PRN

## 2016-09-03 MED ORDER — SODIUM CHLORIDE 0.9 % IV SOLN
Freq: Once | INTRAVENOUS | Status: AC
  Administered 2016-09-03: 1000 mL via INTRAVENOUS

## 2016-09-03 NOTE — ED Provider Notes (Signed)
[facility] Emergency Department Provider Note   ____________________________________________   First MD Initiated Contact with Patient 09/03/16 1112     (approximate)  I have reviewed the triage vital signs and the nursing notes.   HISTORY  Chief Complaint Emesis    HPI Sherin Quarrynn Crissey [place]son [place] is a 25 y.o. female patient reports intermittent nausea vomiting and diarrhea for about a week. Today it's been pretty much ongoing. She had a fever 100.4 today. She also complains of a rash that seemed to start around her On when it was put in a notes on her right arm she has some red spots with a darker red Center light around the periphery almost look hemorrhagic in the center. They do not appear to age. Patient also reports dark vaginal bleeding for about a month. She says her next one on his almost expired. [facility]   History reviewed. No pertinent past medical history.  There are no active problems to display for this patient.   History reviewed. No pertinent surgical history.  Prior to Admission medications   Medication Sig Start Date End Date Taking? Authorizing Provider  hydrocortisone 1 % ointment Apply 1 application topically 2 (two) times daily. 08/13/16   Joni Reiningonald K Smith, PA-C  ondansetron (ZOFRAN ODT) 4 MG disintegrating tablet Take 1 tablet (4 mg total) by mouth every 8 (eight) hours as needed for nausea or vomiting. 09/03/16   Arnaldo NatalPaul F Zack Crager, MD  promethazine-dextromethorphan (PROMETHAZINE-DM) 6.25-15 MG/5ML syrup Take 5 mLs by mouth 4 (four) times daily as needed for cough. 08/13/16   Joni Reiningonald K Smith, PA-C    Allergies Amoxicillin and Penicillins  No family history on file.  Social History Social History  Substance Use Topics  . Smoking status: Current Every Day Smoker    Packs/day: 0.50    Types: Cigarettes  . Smokeless tobacco: Never Used  . Alcohol use No     Comment: socially    Review of  Systems Constitutional: fever/chills Eyes: No visual changes. ENT: No sore throat. Cardiovascular: Denies chest pain. Respiratory: Denies shortness of breath. Gastrointestinal: See history of present illness Genitourinary: Negative for dysuria. Musculoskeletal: Negative for back pain. Skin: Negative for rash. Neurological: Negative for headaches, focal weakness or numbness.  10-point ROS otherwise negative.  ____________________________________________   PHYSICAL EXAM:  VITAL SIGNS: ED Triage Vitals  Enc Vitals Group     BP 09/03/16 1011 121/70     Pulse Rate 09/03/16 1011 (!) 116     Resp 09/03/16 1011 20     Temp 09/03/16 1011 (!) 100.4 F (38 C)     Temp Source 09/03/16 1011 Oral     SpO2 09/03/16 1011 100 %     Weight 09/03/16 1012 130 lb (59 kg)     Height 09/03/16 1012 5\' 4"  (1.626 m)     Head Circumference --      Peak Flow --      Pain Score 09/03/16 1012 3     Pain Loc --      Pain Edu? --      Excl. in GC? --     Constitutional: Alert and oriented. Well appearing and in no acute distress. Eyes: Conjunctivae are normal. PERRL. EOMI. Head: Atraumatic. Nose: No congestion/rhinnorhea. Mouth/Throat: Mucous membranes are moist.  Oropharynx non-erythematous. Neck: No stridor.  Cardiovascular: Normal rate, regular rhythm. Grossly normal heart sounds.  Good peripheral circulation. Respiratory: Normal respiratory effort.  No retractions. Lungs CTAB. Gastrointestinal: Soft  and nontender. No distention. No abdominal bruits. No CVA tenderness. Genitourinary: Normal perineum and vagina. No vaginal discharge no bleeding at this time there is no cervical motion tenderness or adnexal tenderness and there are no adnexal masses Musculoskeletal: No lower extremity tenderness nor edema.  No joint effusions. Neurologic:  Normal speech and language. No gross focal neurologic deficits are appreciated. No gait instability. Skin:  Skin is warm, dry and intact. Rashes described  above the limb the right forearm      ____________________________________________   LABS (all labs ordered are listed, but only abnormal results are displayed)  Labs Reviewed  GASTROINTESTINAL PANEL BY PCR, STOOL (REPLACES STOOL CULTURE) - Abnormal; Notable for the following:       Result Value   Norovirus GI/GII DETECTED (*)    All other components within normal limits  WET PREP, GENITAL - Abnormal; Notable for the following:    WBC, Wet Prep HPF POC MODERATE (*)    All other components within normal limits  COMPREHENSIVE METABOLIC PANEL - Abnormal; Notable for the following:    Sodium 134 (*)    Total Protein 8.2 (*)    ALT 13 (*)    All other components within normal limits  URINALYSIS, COMPLETE (UACMP) WITH MICROSCOPIC - Abnormal; Notable for the following:    Color, Urine YELLOW (*)    APPearance HAZY (*)    Hgb urine dipstick MODERATE (*)    Ketones, ur 5 (*)    Leukocytes, UA MODERATE (*)    Bacteria, UA RARE (*)    Squamous Epithelial / LPF 6-30 (*)    All other components within normal limits  CHLAMYDIA/NGC RT PCR ([facility] ONLY)  C DIFFICILE QUICK SCREEN W PCR REFLEX  LIPASE, BLOOD  CBC  POCT PREGNANCY, URINE   ____________________________________________  EKG   ____________________________________________  RADIOLOGY  Study Result   CLINICAL DATA:  Vaginal bleeding, 6 weeks.  EXAM: TRANSABDOMINAL AND TRANSVAGINAL ULTRASOUND OF PELVIS  TECHNIQUE: Both transabdominal and transvaginal ultrasound examinations of the pelvis were performed. Transabdominal technique was performed for global imaging of the pelvis including uterus, ovaries, adnexal regions, and pelvic cul-de-sac. It was necessary to proceed with endovaginal exam following the transabdominal exam to visualize the uterus and endometrium.  COMPARISON:  09/01/2013 and 11/10/2011 CT  FINDINGS: Uterus  Measurements: 6.6 by 2.6 by 3.8 cm. No fibroids or other  mass visualized.  Endometrium  Thickness: 1 mm.  No focal abnormality visualized.  Right ovary  Measurements: 2.2 by 1.6 by 2.3 cm. Normal appearance/no adnexal mass.  Left ovary  Measurements: 3.5 by 1.9 by 3.1 cm. Normal appearance/no adnexal mass. There is a 2.9 by 1.7 by 2.5 cm cyst with a small septation along its margin was compatible with a cumulus oophorus. No abnormal hypervascularity in this cystic lesion.  Other findings  No abnormal free fluid.  IMPRESSION: 1. No abnormality of the uterus or endometrium. 2. 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 No free pelvic fluid.   Electronically Signed   By: Gaylyn RongWalter  Liebkemann M.D.   On: 09/03/2016 12:15     ____________________________________________   PROCEDURES  Procedure(s) performed:  Procedures  Critical Care performed:   ____________________________________________   INITIAL IMPRESSION / ASSESSMENT AND PLAN / ED COURSE  Pertinent labs & imaging results that were available during my care of the patient were reviewed by me and considered  in my medical decision making (see chart for details).    Clinical Course      ____________________________________________   FINAL CLINICAL IMPRESSION(S) / ED DIAGNOSES  Final diagnoses:  Nausea vomiting and diarrhea   Actual diagnosis is noro virus infection   NEW MEDICATIONS STARTED DURING THIS VISIT:  New Prescriptions   ONDANSETRON (ZOFRAN ODT) 4 MG DISINTEGRATING TABLET    Take 1 tablet (4 mg total) by mouth every 8 (eight) hours as needed for nausea or vomiting.     Note:  This document was prepared using Dragon voice recognition software and may include unintentional dictation errors.    Arnaldo Natal, MD 09/03/16 [phone]

## 2016-09-03 NOTE — Discharge Instructions (Signed)
Drink plenty of fluids. For now I would try clear liquids this includes Jell-O, chicken broth, flat sodas, weak tea and sports drinks. Tomorrow you can try the Bratt diet bananas rice applesauce and toast. By lunch time he probably will be able to eat regular food. Be very careful norovirus is very infectious. Be careful to wash your hands anytime you go to the bathroom. Return here for worse pain fever lightheadedness or any other problems. Will give you Zofran melt on your tongue medicine for nausea can take that one 3 times a day as needed.

## 2016-09-03 NOTE — ED Triage Notes (Signed)
Patient here with onset of N/V and diarrhea last PM.  Temp 100.4.  Patient also states she is having vaginal bleeding X 1 month.  Describes as "old dark brown" discharge.  Nexpalonon implant used for birth control

## 2016-10-02 ENCOUNTER — Emergency Department: Payer: Self-pay

## 2016-10-02 ENCOUNTER — Encounter: Payer: Self-pay | Admitting: Emergency Medicine

## 2016-10-02 ENCOUNTER — Emergency Department
Admission: EM | Admit: 2016-10-02 | Discharge: 2016-10-02 | Disposition: A | Discharge status: Home / Self Care | Payer: Self-pay | Attending: Emergency Medicine | Admitting: Emergency Medicine

## 2016-10-02 DIAGNOSIS — F17210 Nicotine dependence, cigarettes, uncomplicated: Secondary | ICD-10-CM | POA: Insufficient documentation

## 2016-10-02 DIAGNOSIS — R112 Nausea with vomiting, unspecified: Secondary | ICD-10-CM

## 2016-10-02 DIAGNOSIS — N200 Calculus of kidney: Secondary | ICD-10-CM | POA: Insufficient documentation

## 2016-10-02 DIAGNOSIS — R109 Unspecified abdominal pain: Secondary | ICD-10-CM | POA: Insufficient documentation

## 2016-10-02 DIAGNOSIS — R3129 Other microscopic hematuria: Secondary | ICD-10-CM

## 2016-10-02 LAB — BASIC METABOLIC PANEL
Anion gap: 7 (ref 5–15)
BUN: 10 mg/dL (ref 6–20)
CHLORIDE: 108 mmol/L (ref 101–111)
CO2: 24 mmol/L (ref 22–32)
Calcium: 9.4 mg/dL (ref 8.9–10.3)
Creatinine, Ser: 0.7 mg/dL (ref 0.44–1.00)
GFR calc non Af Amer: >60 mL/min (ref >60)
Glucose, Bld: 88 mg/dL (ref 65–99)
POTASSIUM: 3.5 mmol/L (ref 3.5–5.1)
Sodium: 139 mmol/L (ref 135–145)

## 2016-10-02 LAB — URINALYSIS, COMPLETE (UACMP) WITH MICROSCOPIC
Bilirubin Urine: NEGATIVE
Glucose, UA: NEGATIVE mg/dL
Ketones, ur: NEGATIVE mg/dL
Nitrite: NEGATIVE
Protein, ur: 30 mg/dL — AB
Specific Gravity, Urine: 1.025 (ref 1.005–1.030)
pH: 6 (ref 5.0–8.0)

## 2016-10-02 LAB — CBC
HEMATOCRIT: 37.1 % (ref 35.0–47.0)
Hemoglobin: 12.8 g/dL (ref 12.0–16.0)
MCH: 32.1 pg (ref 26.0–34.0)
MCHC: 34.4 g/dL (ref 32.0–36.0)
MCV: 93.4 fL (ref 80.0–100.0)
Platelets: 224 10*3/uL (ref 150–440)
RBC: 3.97 MIL/uL (ref 3.80–5.20)
RDW: 13 % (ref 11.5–14.5)
WBC: 8.8 10*3/uL (ref 3.6–11.0)

## 2016-10-02 LAB — POCT PREGNANCY, URINE: Preg Test, Ur: NEGATIVE

## 2016-10-02 MED ORDER — PROMETHAZINE HCL 25 MG PO TABS: 25.0000 mg | ORAL_TABLET | Freq: Four times a day (QID) | ORAL | 0 refills | Status: DC | PRN

## 2016-10-02 MED ORDER — KETOROLAC TROMETHAMINE 30 MG/ML IJ SOLN
30.0000 mg | Freq: Once | INTRAMUSCULAR | Status: AC
  Administered 2016-10-02: 30 mg via INTRAMUSCULAR
  Filled 2016-10-02: qty 1

## 2016-10-02 NOTE — ED Triage Notes (Signed)
Pt comes into the ED via POV c/o right sided flank/lower back pain that had a sudden onset two days ago.  Patient denies any history of injury.  Was diagnosed with the norovirus 4 weeks ago.  Patient in NAD at this time with even and unlabored respirations.  Patient able to ambulate independently to triage room.  Patient denies any difficulty urinating or urinating urgency.  Denies any history of kidney stones. Patient states she has not been urinating as much recently.

## 2016-10-02 NOTE — Discharge Instructions (Signed)
Please schedule appointments to follow up with GI and urology.

## 2016-10-02 NOTE — ED Provider Notes (Signed)
[facility] Emergency Department Provider Note  ____________________________________________  Time seen: Approximately 1:38 PM  I have reviewed the triage vital signs and the nursing notes.   HISTORY  Chief Complaint Flank Pain    HPI Selena Nelson is a 26 y.o. female , NAD, presents to the emergency department with2 day history of right-sided flank and lower back pain. States she was seen in this emergency department approximately one month ago for similar pain but it was accompanied by nausea, vomiting and diarrhea. Was diagnosed with nor a virus and states that the diarrhea stopped but she has continued to have intermittent nausea and vomiting. States that over the last couple days has had onset of severe and intermittent right flank pain. Denies any injuries, traumas or falls. Has had no numbness, weakness or tingling. Denies any dysuria, hematuria, vaginal discharge or bleeding. Has had no pelvic pain. Does note some decrease in urinary output, but is able to urinate. States that she is started a new job and has been drinking more energy drinks. Denies chest pain, shortness breath. No saddle paresthesias or loss of bowel or bladder control.   History reviewed. No pertinent past medical history.  There are no active problems to display for this patient.   History reviewed. No pertinent surgical history.  Prior to Admission medications   Medication Sig Start Date End Date Taking? Authorizing Provider  hydrocortisone 1 % ointment Apply 1 application topically 2 (two) times daily. 08/13/16   Joni Reiningonald K Smith, PA-C  ondansetron (ZOFRAN ODT) 4 MG disintegrating tablet Take 1 tablet (4 mg total) by mouth every 8 (eight) hours as needed for nausea or vomiting. 09/03/16   Arnaldo NatalPaul F Malinda, MD  promethazine (PHENERGAN) 25 MG tablet Take 1 tablet (25 mg total) by mouth every 6 (six) hours as needed for nausea or vomiting. 10/02/16   Zarianna Dicarlo L Taliyah Watrous, PA-C   promethazine-dextromethorphan (PROMETHAZINE-DM) 6.25-15 MG/5ML syrup Take 5 mLs by mouth 4 (four) times daily as needed for cough. 08/13/16   Joni Reiningonald K Smith, PA-C    Allergies Amoxicillin and Penicillins  No family history on file.  Social History Social History  Substance Use Topics  . Smoking status: Current Every Day Smoker    Packs/day: 0.50    Types: Cigarettes  . Smokeless tobacco: Never Used  . Alcohol use No     Comment: socially     Review of Systems  Constitutional: No fever/chills Cardiovascular: No chest pain. Respiratory: No shortness of breath. No wheezing.  Gastrointestinal: Positive for right flank pain, intermittent nausea and vomiting. No diarrhea.  No constipation. Genitourinary: Negative for dysuria, hematuria, vaginal bleeding, vaginal discharge, pelvic pain. No urinary hesitancy, urgency or increased frequency. Musculoskeletal: Positive right lower back pain. No extremity pain. Skin: Negative for rash, redness, swelling, bruising, skin sores. Neurological: Negative for headaches, focal weakness or numbness. 10-point ROS otherwise negative.  ____________________________________________   PHYSICAL EXAM:  VITAL SIGNS: ED Triage Vitals  Enc Vitals Group     BP 10/02/16 1139 (!) 127/96     Pulse Rate 10/02/16 1139 86     Resp 10/02/16 1139 16     Temp 10/02/16 1139 98.2 F (36.8 C)     Temp Source 10/02/16 1139 Oral     SpO2 10/02/16 1139 100 %     Weight 10/02/16 1139 130 lb (59 kg)     Height 10/02/16 1139 5\' 3"  (1.6 m)     Head Circumference --      Peak  Flow --      Pain Score 10/02/16 1141 7     Pain Loc --      Pain Edu? --      Excl. in GC? --      Constitutional: Alert and oriented. Well appearing and in no acute distress. Eyes: Conjunctivae are normal.  Head: Atraumatic. Neck: Supple with full range of motion. Hematological/Lymphatic/Immunilogical: No cervical lymphadenopathy. Cardiovascular: Normal rate, regular rhythm. Normal  S1 and S2.  Good peripheral circulation. Respiratory: Normal respiratory effort without tachypnea or retractions. Lungs CTAB with breath sounds noted in all lung fields. No wheeze, rhonchi, rales. Gastrointestinal: Soft and nontender without distention or guarding in all quadrants. No rebound or rigidity. Bowel sounds present and normoactive in all quadrants. Mild right CVA tenderness. No left CVA tenderness. Musculoskeletal: Full range of motion of bilateral upper and lower extremities without pain or difficulty. Mild tenderness to palpation of the right lower lateral muscular region of the lower back. No muscle spasm appreciated. No central thoracic or lumbar tenderness, step offs or crepitus noted. Neurologic:  Normal speech and language. No gross focal neurologic deficits are appreciated.  Skin:  Skin is warm, dry and intact. No rash, redness, swelling, skin sores, bruising noted. Psychiatric: Mood and affect are normal. Speech and behavior are normal. Patient exhibits appropriate insight and judgement.   ____________________________________________   LABS (all labs ordered are listed, but only abnormal results are displayed)  Labs Reviewed  URINALYSIS, COMPLETE (UACMP) WITH MICROSCOPIC - Abnormal; Notable for the following:       Result Value   Color, Urine YELLOW (*)    APPearance CLEAR (*)    Hgb urine dipstick LARGE (*)    Protein, ur 30 (*)    Leukocytes, UA MODERATE (*)    Bacteria, UA RARE (*)    Squamous Epithelial / LPF 0-5 (*)    All other components within normal limits  BASIC METABOLIC PANEL  CBC  POC URINE PREG, ED  POCT PREGNANCY, URINE   ____________________________________________  EKG  None ____________________________________________  RADIOLOGY I, Ernestene Kiel Lakiesha Ralphs, personally viewed and evaluated these images (plain radiographs) as part of my medical decision making, as well as reviewing the written report by the radiologist.  Ct Renal Stone Study  Result  Date: 10/02/2016 CLINICAL DATA:  Right flank and lower back pain sudden onset 2 days ago. EXAM: CT ABDOMEN AND PELVIS WITHOUT CONTRAST TECHNIQUE: Multidetector CT imaging of the abdomen and pelvis was performed following the standard protocol without IV contrast. COMPARISON:  Multiple exams, including 10/31/2011 FINDINGS: Lower chest: Stable mild scarring in the right middle lobe. Hepatobiliary: Gallbladder wall thickening, but the gallbladder is nondistended. Pancreas: Unremarkable Spleen: Unremarkable Adrenals/Urinary Tract: Adrenal glands normal. 2 mm left kidney upper pole nonobstructive renal calculus. Otherwise unremarkable. Stomach/Bowel: Unremarkable. No appreciable wall thickening in the visualized appendiceal segment, appendiceal tip not well seen. Vascular/Lymphatic: Borderline enlarged left external iliac lymph node at 1 cm short axis, previously 1.1 cm in short axis. Reproductive: Fluid density 2.8 cm left ovarian lesion, image 69/2. Other: No supplemental non-categorized findings. Musculoskeletal: Unremarkable.  No lumbar impingement identified. IMPRESSION: 1. There is wall thickening in the gallbladder but this may simply be due to nondistention. Correlate with symptoms in determining whether sonography or other gallbladder workup is warranted. 2. Nonobstructive left nephrolithiasis. 3. 2.8 cm fluid density lesion of the left ovary, likely a cyst. Electronically Signed   By: Gaylyn Rong M.D.   On: 10/02/2016 14:33   US Abdomen Limited  Ruq  Result Date: 10/02/2016 CLINICAL DATA:  Low-grade fever with right flank pain EXAM: US ABDOMEN LIMITED - RIGHT UPPER QUADRANT COMPARISON:  CT 10/02/2016 FINDINGS: Gallbladder: Slightly echogenic mass with shadowing in the gallbladder, consistent with a stone, this measures 1.1 cm. The gallbladder is contracted. Gallbladder wall thickness is within normal limits at 2.1 mm. Negative sonographic Murphy's per sonographer. Common bile duct: Diameter: Normal at  4.8 mm Liver: No focal lesion identified. Within normal limits in parenchymal echogenicity. IMPRESSION: 1. Contracted appearing gallbladder. Apparent gallstone measuring 1.1 cm. Wall thickness within normal limits. No focal tenderness in the right upper quadrant 2. No biliary dilatation Electronically Signed   By: Jasmine Pang M.D.   On: 10/02/2016 16:40    ____________________________________________    PROCEDURES  Procedure(s) performed: None   Procedures   Medications  ketorolac (TORADOL) 30 MG/ML injection 30 mg (30 mg Intramuscular Given 10/02/16 1718)     ____________________________________________   INITIAL IMPRESSION / ASSESSMENT AND PLAN / ED COURSE  Pertinent labs & imaging results that were available during my care of the patient were reviewed by me and considered in my medical decision making (see chart for details).  Clinical Course as of Oct 02 1740  Thu Oct 02, 2016  1650 All lab results and imaging results were discussed with the patient in detail. Right ultrasound revealed multiple 1 cm gallstone but no gallbladder wall thickening. CT scan showed left-sided nephrolithiasis without obstruction which I anticipate is causing the microscopic hematuria. Urine culture has been ordered to further assess leukocytes in the urine. Patient is currently asymptomatic for UTI and has had no pelvic pain or discharge. Will await on urine culture results to treat further. Patient has had no nausea, vomiting mild in the emergency department and has had no increase of acute symptoms. Patient understands that she will need to follow up with urology as well as GI in regards to her symptoms and verbalizes understanding of such.  [JH]    Clinical Course User Index [JH] Chaitra Mast L Yalonda Sample, PA-C    Patient's diagnosis is consistent with right flank pain, not intractable nausea with vomiting, microscopic hematuria and left nephrolithiasis. Patient was given IM Toradol while in the emergency  department and tolerated well. Patient will be discharged home with prescriptions for promethazine to take as needed. Patient is to follow up with Dr. Sherryl Barters in urology and Dr. Servando Snare and gastroenterology for further evaluation and treatment of symptoms. Patient was given the name of a general surgeon in case that she decided to consult with them in regards to elective cholecystectomy.  Patient is given ED precautions to return to the ED for any worsening or new symptoms.    ____________________________________________  FINAL CLINICAL IMPRESSION(S) / ED DIAGNOSES  Final diagnoses:  Right flank pain  Non-intractable vomiting with nausea, unspecified vomiting type  Other microscopic hematuria  Nephrolithiasis      NEW MEDICATIONS STARTED DURING THIS VISIT:  Discharge Medication List as of 10/02/2016  4:59 PM    START taking these medications   Details  promethazine (PHENERGAN) 25 MG tablet Take 1 tablet (25 mg total) by mouth every 6 (six) hours as needed for nausea or vomiting., Starting Thu 10/02/2016, Print             Hope Pigeon, PA-C 10/02/16 1743    Nita Sickle, MD 10/06/16 2116

## 2016-10-22 ENCOUNTER — Ambulatory Visit: Payer: Self-pay | Admitting: Surgery

## 2017-03-02 ENCOUNTER — Encounter: Payer: Self-pay | Admitting: Surgery

## 2017-03-02 ENCOUNTER — Ambulatory Visit (INDEPENDENT_AMBULATORY_CARE_PROVIDER_SITE_OTHER): Payer: Medicaid Other | Admitting: Surgery

## 2017-03-02 VITALS — BP 106/69 | HR 54 | Temp 98.4°F | Ht 63.0 in | Wt 121.6 lb

## 2017-03-02 DIAGNOSIS — R1010 Upper abdominal pain, unspecified: Secondary | ICD-10-CM | POA: Diagnosis not present

## 2017-03-02 NOTE — Patient Instructions (Signed)
Please pick up Prilosec at your local pharmacy and take as instructed.  We will follow up with a GI appointment.   Please call our office if you have any questions or concerns.

## 2017-03-02 NOTE — Progress Notes (Signed)
03/02/2017  Reason for Visit:  Abdominal pain  History of Present Illness: Selena Nelson is a 26 y.o. female who presents with a six-month history of abdominal symptoms. She presented to the emergency room in 09/2016 with abdominal discomfort and was diagnosed with cholelithiasis with an ultrasound showing a gallstone measuring 1.1 cm in size. There was no evidence of acute cholecystitis at that time. However she had no insurance and was lost to follow-up. She presents today because she reports that over the 6 months she continues to have symptoms.  She reports that after eating any meal, she will start having fullness and heaviness of her stomach with the heavy and fullness sensation associated with some burning in the right upper quadrant and epigastric regions. Then afterwards about 30 minutes later, she will have a bowel movement that will be loose. She has noted that her stool is darker in color. Her symptoms are associated with nausea and she has been taking Phenergan that was prescribed and refilled by the emergency department which has been helping with her symptoms. She describes that eating avocados is better for her as she has fewer symptoms with them. She has been taking Advil and aspirin for pain control and reports taking multiple doses per day. Denies any stabbing type of pain in the right upper quadrant. Denies any bright red blood per rectum. Denies any fevers or chills. Denies any chest pain or shortness of breath. Denies any constipation.  Past Medical History: -History of substance abuse.  -History of abdominal pain -Lactose intolerance  Past Surgical History: -None  Home Medications: Prior to Admission medications   Medication Sig Start Date End Date Taking? Authorizing Provider  hydrocortisone 1 % ointment Apply 1 application topically 2 (two) times daily. 08/13/16  Yes Joni Reining, PA-C  ondansetron (ZOFRAN ODT) 4 MG disintegrating tablet Take 1 tablet (4 mg  total) by mouth every 8 (eight) hours as needed for nausea or vomiting. 09/03/16  Yes Arnaldo Natal, MD  promethazine (PHENERGAN) 25 MG tablet Take 1 tablet (25 mg total) by mouth every 6 (six) hours as needed for nausea or vomiting. 10/02/16  Yes Hagler, Jami L, PA-C  promethazine-dextromethorphan (PROMETHAZINE-DM) 6.25-15 MG/5ML syrup Take 5 mLs by mouth 4 (four) times daily as needed for cough. 08/13/16  Yes Joni Reining, PA-C    Allergies: Allergies  Allergen Reactions  . Amoxicillin Other (See Comments)    bradycardia  . Penicillins Other (See Comments)    bradycardia    Social History:  reports that she has been smoking Cigarettes.  She has been smoking about 0.50 packs per day. She has never used smokeless tobacco. She reports that she does not drink alcohol, And her last drug use was 2 months ago. 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  Family History: No history of gallbladder disease in the family  Review of Systems: Review of Systems  Constitutional: Negative for chills and fever.  HENT: Negative for hearing loss.   Eyes: Negative for blurred vision.  Respiratory: Negative for cough.   Cardiovascular: Negative for chest pain.  Gastrointestinal: Positive for abdominal pain, diarrhea and nausea. Negative for constipation and heartburn.  Genitourinary: Negative for dysuria.  Musculoskeletal: Negative for myalgias.  Skin: Negative for rash.  Neurological: Negative for dizziness.  Psychiatric/Behavioral: Negative for depression.    Physical Exam BP 106/69   Pulse (!) 54   Temp 98.4 F (36.9 C) (Oral)   Ht 5\' 3"  (1.6 m)   Wt 55.2 kg (121  lb 9.6 oz)   BMI 21.54 kg/m  CONSTITUTIONAL: No acute distress HEENT:  Normocephalic, atraumatic, extraocular motion intact. NECK: Trachea is midline, and there is no jugular venous distension.  RESPIRATORY:  Lungs are clear, and breath sounds are equal bilaterally. Normal respiratory effort without pathologic use of accessory  muscles. CARDIOVASCULAR: Heart is regular without murmurs, gallops, or rubs. GI: The abdomen is soft, nondistended, with tenderness to palpation in the epigastric region as well as right upper quadrant. There is minimal discomfort with palpation the right lower quadrant. There were no palpable masses.  MUSCULOSKELETAL:  Normal muscle strength and tone in all four extremities.  No peripheral edema or cyanosis. SKIN: Skin turgor is normal. There are no pathologic skin lesions.  NEUROLOGIC:  Motor and sensation is grossly normal.  Cranial nerves are grossly intact. PSYCH:  Alert and oriented to person, place and time. Affect is normal.  Laboratory Analysis: No results found for this or any previous visit (from the past 24 hour(s)).  Imaging: Abdominal ultrasound from January 2018 shows cholelithiasis with a gallstone measuring 1.1 cm but otherwise no evidence of acute cholecystitis. CT scan also from general 2018 shows nonobstructive left nephrolithiasis and a left ovarian cyst  Assessment and Plan: This is a 26 y.o. female who presents with abdominal pain ongoing for at least 6 months. I have independently reviewed the patient's imaging from January 2018 as well as reviewed her labs from then.  Discussed with the patient that currently her symptoms may represent a variety of different issues. She could be having potential gallbladder pathology but some of her symptoms did not correlate with the typical gallbladder symptoms. She does take Advil and aspirin very frequently and this could also represent potential peptic ulcer disease. At the same time she could be having potential intestinal issues such as IBS. At this point is no clear etiology and there is no urgent surgical need. Discussed with the patient defers we can start with taking Prilosec over-the-counter to help with potential ulcer disease given the amount of Advil and aspirin that she is taking as well as to stay away from those  medications. We'll also send a referral to gastroenterology for evaluation for abdominal pain. She could potentially require a HIDA scan versus further testing for IBS or other pathology. If this indeed is more gallbladder pathology, then we'll be happy to help and set her up for surgery in the future. Patient understands this plan and all of her questions have been answered.   Face-to-face time spent with the patient and care providers was 45 minutes, with more than 50% of the time spent counseling, educating, and coordinating care of the patient.     Howie IllJose Luis Catharine Kettlewell, MD [facility]

## 2017-03-04 ENCOUNTER — Telehealth: Payer: Self-pay

## 2017-03-04 NOTE — Telephone Encounter (Signed)
Called [facility] at this time and spoke with Kennith Centerracey and was able to schedule patient for 7/24 at 1:15 at Dr Thurston [facility].  Called patient at this time and left a message with appointment information. Patient called back and I was able to give her this information.

## 2017-04-14 ENCOUNTER — Ambulatory Visit: Payer: Medicaid Other | Admitting: Gastroenterology

## 2017-04-14 ENCOUNTER — Encounter: Payer: Self-pay | Admitting: Gastroenterology

## 2017-11-20 HISTORY — PX: MOUTH SURGERY: SHX715

## 2017-11-22 ENCOUNTER — Encounter: Payer: Self-pay | Admitting: Emergency Medicine

## 2017-11-22 ENCOUNTER — Emergency Department
Admission: EM | Admit: 2017-11-22 | Discharge: 2017-11-22 | Disposition: A | Discharge status: Home / Self Care | Payer: Medicaid Other | Attending: Emergency Medicine | Admitting: Emergency Medicine

## 2017-11-22 DIAGNOSIS — Z79899 Other long term (current) drug therapy: Secondary | ICD-10-CM | POA: Diagnosis not present

## 2017-11-22 DIAGNOSIS — R112 Nausea with vomiting, unspecified: Secondary | ICD-10-CM | POA: Diagnosis present

## 2017-11-22 DIAGNOSIS — F17210 Nicotine dependence, cigarettes, uncomplicated: Secondary | ICD-10-CM | POA: Diagnosis not present

## 2017-11-22 DIAGNOSIS — K2901 Acute gastritis with bleeding: Secondary | ICD-10-CM

## 2017-11-22 LAB — CBC
HCT: 37.3 % (ref 35.0–47.0)
HEMOGLOBIN: 12.6 g/dL (ref 12.0–16.0)
MCH: 31.8 pg (ref 26.0–34.0)
MCHC: 33.8 g/dL (ref 32.0–36.0)
MCV: 94.2 fL (ref 80.0–100.0)
Platelets: 159 10*3/uL (ref 150–440)
RBC: 3.96 MIL/uL (ref 3.80–5.20)
RDW: 13.2 % (ref 11.5–14.5)
WBC: 3 10*3/uL — ABNORMAL LOW (ref 3.6–11.0)

## 2017-11-22 LAB — URINALYSIS, COMPLETE (UACMP) WITH MICROSCOPIC
Bacteria, UA: NONE SEEN
Bilirubin Urine: NEGATIVE
GLUCOSE, UA: NEGATIVE mg/dL
Hgb urine dipstick: NEGATIVE
Ketones, ur: NEGATIVE mg/dL
Leukocytes, UA: NEGATIVE
NITRITE: NEGATIVE
PH: 5 (ref 5.0–8.0)
PROTEIN: NEGATIVE mg/dL
Specific Gravity, Urine: 1.02 (ref 1.005–1.030)

## 2017-11-22 LAB — COMPREHENSIVE METABOLIC PANEL
ALT: 16 U/L (ref 14–54)
ANION GAP: 9 (ref 5–15)
AST: 28 U/L (ref 15–41)
Albumin: 3.8 g/dL (ref 3.5–5.0)
Alkaline Phosphatase: 82 U/L (ref 38–126)
BUN: 13 mg/dL (ref 6–20)
CHLORIDE: 106 mmol/L (ref 101–111)
CO2: 24 mmol/L (ref 22–32)
Calcium: 9.1 mg/dL (ref 8.9–10.3)
Creatinine, Ser: 0.57 mg/dL (ref 0.44–1.00)
Glucose, Bld: 95 mg/dL (ref 65–99)
Potassium: 3.8 mmol/L (ref 3.5–5.1)
SODIUM: 139 mmol/L (ref 135–145)
Total Bilirubin: 1 mg/dL (ref 0.3–1.2)
Total Protein: 6.4 g/dL — ABNORMAL LOW (ref 6.5–8.1)

## 2017-11-22 LAB — TYPE AND SCREEN
ABO/RH(D): O NEG
Antibody Screen: NEGATIVE

## 2017-11-22 LAB — POCT PREGNANCY, URINE: Preg Test, Ur: NEGATIVE

## 2017-11-22 MED ORDER — GI COCKTAIL ~~LOC~~
30.0000 mL | Freq: Once | ORAL | Status: AC
  Administered 2017-11-22: 30 mL via ORAL

## 2017-11-22 MED ORDER — PANTOPRAZOLE SODIUM 40 MG IV SOLR
40.0000 mg | Freq: Once | INTRAVENOUS | Status: AC
  Administered 2017-11-22: 40 mg via INTRAVENOUS
  Filled 2017-11-22: qty 40

## 2017-11-22 MED ORDER — SODIUM CHLORIDE 0.9 % IV SOLN
1000.0000 mL | Freq: Once | INTRAVENOUS | Status: AC
  Administered 2017-11-22: 1000 mL via INTRAVENOUS

## 2017-11-22 MED ORDER — GI COCKTAIL ~~LOC~~
ORAL | Status: AC
  Administered 2017-11-22: 30 mL via ORAL
  Filled 2017-11-22: qty 30

## 2017-11-22 MED ORDER — OMEPRAZOLE 40 MG PO CPDR: 40.0000 mg | DELAYED_RELEASE_CAPSULE | Freq: Every day | ORAL | 1 refills | Status: AC

## 2017-11-22 NOTE — ED Notes (Signed)
In with Dr. Cyril LoosenKinner for rectal exam

## 2017-11-22 NOTE — ED Notes (Signed)
Orthostatic vital signs: Lying- 98/65, HR 102 Sitting- 95/69. HR 108 Standing- 105/75, HR 106

## 2017-11-22 NOTE — ED Triage Notes (Signed)
Pt comes into the ED via POV c/o sudden onset of lower abdominal pain that was associated with rectal bleeding and vomiting blood.  The boyfriend states that he found her on the side fo the toilet with blood in the toilet as well as around her mouth.  Patient c/o being dizzy and weak.  Patient has a known need for her gallbladder to be removed and she is concerned this is related to it.  Patient in NAD at this time with even and unlabored respirations.  Patient recently has teeth pulled and she states she hasn't had oral bleeding since two days ago.

## 2017-11-22 NOTE — ED Provider Notes (Signed)
[facility] Emergency Department Provider Note   ____________________________________________    I have reviewed the triage vital signs and the nursing notes.   HISTORY  Chief Complaint Abdominal Pain and Rectal Bleeding     HPI Selena Nelson is a 27 y.o. female who presents with complaints of nausea and vomiting.  Patient reports earlier today she developed nausea and vomited several times.  1 of the episodes had specks of black in it.  She also had several episodes of diarrhea 1 of the stools looked reddish.  She reports recently had teeth pulled and did have oral bleeding for several days.  Not on blood thinners.  No history of peptic ulcer disease   History reviewed. No pertinent past medical history.  There are no active problems to display for this patient.   Past Surgical History:  Procedure Laterality Date  . TONSILLECTOMY AND ADENOIDECTOMY      Prior to Admission medications   Medication Sig Start Date End Date Taking? Authorizing Provider  Buprenorphine HCl-Naloxone HCl (SUBOXONE SL) Place 1 tablet under the tongue daily.   Yes [provider]  DULoxetine (CYMBALTA) 30 MG capsule Take 90 mg by mouth daily.   Yes [provider]  hydrocortisone 1 % ointment Apply 1 application topically 2 (two) times daily. Patient not taking: Reported on 11/22/2017 08/13/16   Joni Reining, PA-C  omeprazole (PRILOSEC) 40 MG capsule Take 1 capsule (40 mg total) by mouth daily. 11/22/17 11/22/18  Jene Every, MD  ondansetron (ZOFRAN ODT) 4 MG disintegrating tablet Take 1 tablet (4 mg total) by mouth every 8 (eight) hours as needed for nausea or vomiting. Patient not taking: Reported on 11/22/2017 09/03/16   Arnaldo Natal, MD  promethazine (PHENERGAN) 25 MG tablet Take 1 tablet (25 mg total) by mouth every 6 (six) hours as needed for nausea or vomiting. Patient not taking: Reported on 11/22/2017 10/02/16   Hagler, Jami L, PA-C    promethazine-dextromethorphan (PROMETHAZINE-DM) 6.25-15 MG/5ML syrup Take 5 mLs by mouth 4 (four) times daily as needed for cough. Patient not taking: Reported on 11/22/2017 08/13/16   Joni Reining, PA-C     Allergies Amoxicillin and Penicillins  No family history on file.  Social History Social History   Tobacco Use  . Smoking status: Current Every Day Smoker    Packs/day: 0.50    Types: Cigarettes  . Smokeless tobacco: Never Used  Substance Use Topics  . Alcohol use: No    Comment: socially  . Drug use: No    Review of Systems  Constitutional: No fever/chills Eyes: No visual changes.  ENT: No sore throat. Cardiovascular: Denies chest pain. Respiratory: Denies shortness of breath. Gastrointestinal: As above Genitourinary: Negative for dysuria. Musculoskeletal: Negative for back pain. Skin: Negative for rash. Neurological: Negative for headaches   ____________________________________________   PHYSICAL EXAM:  VITAL SIGNS: ED Triage Vitals [11/22/17 1426]  Enc Vitals Group     BP 127/83     Pulse Rate (!) 120     Resp 18     Temp 98.4 F (36.9 C)     Temp Source Oral     SpO2 100 %     Weight 65.8 kg (145 lb)     Height 1.626 m (5\' 4" )     Head Circumference      Peak Flow      Pain Score 4     Pain Loc      Pain Edu?  Excl. in GC?     Constitutional: Alert and oriented. No acute distress. Pleasant and interactive Eyes: Conjunctivae are normal.  ic. Nose: No congestion/rhinnorhea. Mouth/Throat: Mucous membranes are moist.    Cardiovascular: Normal rate, regular rhythm. Grossly normal heart sounds.  Good peripheral circulation. Respiratory: Normal respiratory effort.  No retractions. Lungs CTAB. Gastrointestinal: Soft and nontender. No distention.  Rectal exam, brown stool guaiac negative Genitourinary: deferred Musculoskeletal:  Warm and well perfused Neurologic:  Normal speech and language. No gross focal neurologic deficits are  appreciated.  Skin:  Skin is warm, dry and intact. No rash noted. Psychiatric: Mood and affect are normal. Speech and behavior are normal.  ____________________________________________   LABS (all labs ordered are listed, but only abnormal results are displayed)  Labs Reviewed  COMPREHENSIVE METABOLIC PANEL - Abnormal; Notable for the following components:      Result Value   Total Protein 6.4 (*)    All other components within normal limits  CBC - Abnormal; Notable for the following components:   WBC 3.0 (*)    All other components within normal limits  URINALYSIS, COMPLETE (UACMP) WITH MICROSCOPIC - Abnormal; Notable for the following components:   Color, Urine AMBER (*)    APPearance HAZY (*)    Squamous Epithelial / LPF 6-30 (*)    All other components within normal limits  POC URINE PREG, ED  POCT PREGNANCY, URINE  TYPE AND SCREEN   ____________________________________________  EKG  ED ECG REPORT I, Jene Everyobert Lacrisha Bielicki, the attending physician, personally viewed and interpreted this ECG.  Date: 11/22/2017  Rhythm: normal sinus rhythm QRS Axis: normal Intervals: normal ST/T Wave abnormalities: normal Narrative Interpretation: no evidence of acute ischemia  ____________________________________________  RADIOLOGY  None ____________________________________________   PROCEDURES  Procedure(s) performed: No  Procedures   Critical Care performed: No ____________________________________________   INITIAL IMPRESSION / ASSESSMENT AND PLAN / ED COURSE  Pertinent labs & imaging results that were available during my care of the patient were reviewed by me and considered in my medical decision making (see chart for details).  Patient well-appearing in no acute distress.  Abdominal exam reassuring, no [facility] in the emergency [facility].  Lab work is unremarkable.  Guaiac exam is negative.  Hemoglobin is normal.  Suspect gastritis related to swallowed blood as  patient is asymptomatic and again no further vomiting while in the emergency department.,  Strict return precautions discussed, will start the patient on a PPI she would like to go home    ____________________________________________   FINAL CLINICAL IMPRESSION(S) / ED DIAGNOSES  Final diagnoses:  Acute gastritis with hemorrhage, unspecified gastritis type        Note:  This document was prepared using Dragon voice recognition software and may include unintentional dictation errors.    Jene EveryKinner, Saralee Bolick, MD 11/22/17 2013

## 2017-11-22 NOTE — ED Notes (Signed)
FIRST NURSE NOTE:  Pt arrived via POV with c/o right side abdominal pain, was told she needed her gallbladder out a few months ago, but did not. Pt crying and uncomfortable in triage.

## 2017-11-22 NOTE — ED Notes (Signed)
Pt reports she just got up and urinated but did not give sample, I informed her she needs to give us urine asap

## 2017-11-23 ENCOUNTER — Encounter: Payer: Self-pay | Admitting: *Deleted

## 2017-12-15 ENCOUNTER — Encounter: Payer: Self-pay | Admitting: General Surgery

## 2017-12-15 ENCOUNTER — Ambulatory Visit: Payer: Medicaid Other | Admitting: General Surgery

## 2017-12-15 VITALS — BP 122/76 | HR 78 | Resp 12 | Ht 64.0 in | Wt 157.0 lb

## 2017-12-15 DIAGNOSIS — K8020 Calculus of gallbladder without cholecystitis without obstruction: Secondary | ICD-10-CM | POA: Insufficient documentation

## 2017-12-15 NOTE — Progress Notes (Signed)
Patient ID: Selena Nelson, female   DOB: July 28, 1991, 27 y.o.   MRN: [id number]  Chief Complaint  Patient presents with  . Other    gall bladder    HPI Selena Nelson is a 27 y.o. female.  Here for evaluation of her gall bladder referred by Dr Lacie Scotts. She went to the ED on 11-22-17 for fever, chills and vomiting, they thought it was from taking aleve. She states she has a know gallstone since 2018 when she went to the ED. She states her stomach swells and she gets bloated. She does admit to nausea and vomiting with greasy foods and meats. The pain is "tightness" in right abdominal that is there daily. She saw Dr Aleen Campi but did not want surgery at that time. The plan was to take the Prilosec and modify her diet so she would not have to have surgery but she has changed her mind. Abdominal ultrasound was 10-02-17 at [facility] and at [facility] office it showed that it was larger.  She is currently in a substance abuse program and is on Suboxone.  HPI  Past Medical History:  Diagnosis Date  . GERD (gastroesophageal reflux disease)     Past Surgical History:  Procedure Laterality Date  . TONSILLECTOMY AND ADENOIDECTOMY  1996    Family History  Problem Relation Age of Onset  . Breast cancer Maternal Grandmother     Social History Social History   Tobacco Use  . Smoking status: Current Every Day Smoker    Packs/day: 0.50    Years: 2.00    Pack years: 1.00    Types: Cigarettes  . Smokeless tobacco: Never Used  Substance Use Topics  . Alcohol use: No    Comment: socially  . Drug use: Not Currently    Comment:  history of opioid abuse    Allergies  Allergen Reactions  . Amoxicillin Other (See Comments)    Has patient had a PCN reaction causing immediate rash, facial/tongue/throat swelling, SOB or lightheadedness with hypotension: Yes Has patient had a PCN reaction causing severe rash involving mucus membranes or skin necrosis: Yes Has patient had a PCN reaction that  required hospitalization: Yes Has patient had a PCN reaction occurring within the last 10 years: Yes If all of the above answers are "NO", then may proceed with Cephalosporin use.   Marland Kitchen Penicillins Other (See Comments)    Has patient had a PCN reaction causing immediate rash, facial/tongue/throat swelling, SOB or lightheadedness with hypotension: Yes Has patient had a PCN reaction causing severe rash involving mucus membranes or skin necrosis: Yes Has patient had a PCN reaction that required hospitalization: Yes Has patient had a PCN reaction occurring within the last 10 years: Yes If all of the above answers are "NO", then may proceed with Cephalosporin use.     Current Outpatient Medications  Medication Sig Dispense Refill  . Buprenorphine HCl-Naloxone HCl (SUBOXONE SL) Place 1 tablet under the tongue daily.    . DULoxetine (CYMBALTA) 30 MG capsule Take 90 mg by mouth daily.    Marland Kitchen omeprazole (PRILOSEC) 40 MG capsule Take 1 capsule (40 mg total) by mouth daily. 30 capsule 1   No current facility-administered medications for this visit.     Review of Systems Review of Systems  Constitutional: Negative.   Respiratory: Negative.   Cardiovascular: Negative.   Gastrointestinal: Positive for abdominal pain and constipation. Negative for nausea and vomiting.  Neurological: Positive for weakness.    Blood pressure 122/76, pulse 78,  resp. rate 12, height 5\' 4"  (1.626 m), weight 157 lb (71.2 kg), SpO2 98 %, unknown if currently breastfeeding.  Physical Exam Physical Exam  Constitutional: She is oriented to person, place, and time. She appears well-developed and well-nourished.  HENT:  Mouth/Throat: Oropharynx is clear and moist.  Eyes: Conjunctivae are normal. No scleral icterus.  Neck: Neck supple.  Cardiovascular: Normal rate, regular rhythm and normal heart sounds.  Pulmonary/Chest: Effort normal and breath sounds normal.  Abdominal: Soft. Normal appearance and bowel sounds are  normal. There is tenderness in the right upper quadrant.  Lymphadenopathy:    She has no cervical adenopathy.  Neurological: She is alert and oriented to person, place, and time.  Skin: Skin is warm and dry.  Psychiatric: Her behavior is normal.    Data Reviewed Surgical consultation of March 02, 2017 reviewed.  In office ultrasound of July 17, 2017 from her PCP showed cholelithiasis with a single stone measuring 15 x 17 cm.  Normal common bile duct at 0.49 cm.  No evidence of acute cholecystitis.  CBC dated November 22, 2017 during an emergency room visit showed a white blood cell count of 3000, hemoglobin of 12.6, MCV 94, platelet count of 159,000.  Likely secondary to viral illness. Laboratory studies of July 06, 2017 showed a normal comprehensive metabolic panel. Nonfasting blood sugar low at 60.  Normal liver function studies.  Normal renal function.  Estimated GFR 161.  Assessment    Cholelithiasis, likely chronic cholecystitis.    Plan    Laparoscopic Cholecystectomy with Intraoperative Cholangiogram. The procedure, including it's potential risks and complications (including but not limited to infection, bleeding, injury to intra-abdominal organs or bile ducts, bile leak, poor cosmetic result, sepsis and death) were discussed with the patient in detail. Non-operative options, including their inherent risks (acute calculous cholecystitis with possible choledocholithiasis or gallstone pancreatitis, with the risk of ascending cholangitis, sepsis, and death) were discussed as well. The patient expressed and understanding of what we discussed and wishes to proceed with laparoscopic cholecystectomy. The patient further understands that if it is technically not possible, or it is unsafe to proceed laparoscopically, that I will convert to an open cholecystectomy.  Considering she likely has a 60-year life expectancy, and with enlargement of the stone and serial ultrasound exam it seems  reasonable to proceed to cholecystectomy.     HPI, Physical Exam, Assessment and Plan have been scribed under the direction and in the presence of Earline MayotteJeffrey W. Byrnett, MD. Dorathy DaftMarsha Hatch, RN  I have completed the exam and reviewed the above documentation for accuracy and completeness.  I agree with the above.  [employer] has been used and any errors in dictation or transcription are unintentional.  Donnalee CurryJeffrey Byrnett, M.D., F.A.C.S. The possibility that all of her abdominal symptoms may not be resolved with cholecystectomy was discussed. The patient is [facility]. She will pre admit by phone. The patient is aware of date and instructions.  Documented by Caryl-Lyn Louanna RawM Kennedy LPN  Merrily PewJeffrey W Byrnett 12/15/2017, 9:02 PM

## 2017-12-15 NOTE — Patient Instructions (Addendum)

## 2017-12-17 ENCOUNTER — Encounter
Admission: RE | Admit: 2017-12-17 | Discharge: 2017-12-17 | Disposition: A | Discharge status: Home / Self Care | Payer: Medicaid Other | Source: Ambulatory Visit | Attending: General Surgery | Admitting: General Surgery

## 2017-12-17 ENCOUNTER — Other Ambulatory Visit: Payer: Self-pay

## 2017-12-17 HISTORY — DX: Personal history of urinary calculi: Z87.442

## 2017-12-17 NOTE — Patient Instructions (Signed)
Your procedure is scheduled on: 12-21-17 Report to Same Day Surgery 2nd floor medical mall [facility](Medical Mall Entrance-take elevator on left to 2nd floor.  Check in with surgery information desk.) To find out your arrival time please call [phone] between 1PM - 3PM on 12-18-17  Remember: Instructions that are not followed completely may result in serious medical risk, up to and including death, or upon the discretion of your surgeon and anesthesiologist your surgery may need to be rescheduled.    _x___ 1. Do not eat food after midnight the night before your procedure. NO GUM OR CANDY AFTER MIDNIGHT.  You may drink clear liquids up to 2 hours before you are scheduled to arrive at the hospital for your procedure.  Do not drink clear liquids within 2 hours of your scheduled arrival to the hospital.  Clear liquids include  --Water or Apple juice without pulp  --Clear carbohydrate beverage such as ClearFast or Gatorade  --Black Coffee or Clear Tea (No milk, no creamers, do not add anything to the coffee or Tea    __x__ 2. No Alcohol for 24 hours before or after surgery.   __x__3. No Smoking or e-cigarettes for 24 prior to surgery.  Do not use any chewable tobacco products for at least 6 hour prior to surgery   ____  4. Bring all medications with you on the day of surgery if instructed.    __x__ 5. Notify your doctor if there is any change in your medical condition     (cold, fever, infections).    x___6. On the morning of surgery brush your teeth with toothpaste and water.  You may rinse your mouth with mouth wash if you wish.  Do not swallow any toothpaste or mouthwash.   Do not wear jewelry, make-up, hairpins, clips or nail polish.  Do not wear lotions, powders, or perfumes. You may wear deodorant.  Do not shave 48 hours prior to surgery. Men may shave face and neck.  Do not bring valuables to the hospital.    [facility] any belongings or valuables.    Contacts, dentures or bridgework may not be worn into surgery.  Leave your suitcase in the car. After surgery it may be brought to your room.  For patients admitted to the hospital, discharge time is determined by your treatment team.  _  Patients discharged the day of surgery will not be allowed to drive home.  You will need someone to drive you home and stay with you the night of your procedure.      _x___ TAKE THE FOLLOWING MEDICATION THE MORNING OF SURGERY WITH A SMALL SIP OF WATER. These include:  1. CYMBALTA  2. PRILOSEC  3. TAKE AN EXTRA PRILOSEC BEFORE BED Sunday NIGHT  4.  5.  6.  ____Fleets enema or Magnesium Citrate as directed.   ____ Use CHG Soap or sage wipes as directed on instruction sheet   ____ Use inhalers on the day of surgery and bring to hospital day of surgery  ____ Stop Metformin and Janumet 2 days prior to surgery.    ____ Take 1/2 of usual insulin dose the night before surgery and none on the morning surgery.   ____ Follow recommendations from Cardiologist, Pulmonologist or PCP regarding  stopping Aspirin, Coumadin, Plavix ,Eliquis, Effient, or Pradaxa, and Pletal.  ____Stop Anti-inflammatories such as Advil, Aleve, Ibuprofen, Motrin, Naproxen, Naprosyn, Goodies powders or aspirin products .  OK to take Tylenol    ____  Stop supplements until after surgery.   ____ Bring C-Pap to the hospital.

## 2017-12-17 NOTE — Pre-Procedure Instructions (Signed)
EKG  ED ECG REPORT I, Jene Everyobert Kinner, the attending physician, personally viewed and interpreted this ECG.  Date: 11/22/2017  Rhythm: normal sinus rhythm QRS Axis: normal Intervals: normal ST/T Wave abnormalities: normal Narrative Interpretation: no evidence of acute ischemia  ____________________________________________  RADIOLOGY  None ____________________________________________   PROCEDURES  Procedure(s) performed: No  Procedures   Critical Care performed: No ____________________________________________   INITIAL IMPRESSION / ASSESSMENT AND PLAN / ED COURSE  Pertinent labs & imaging results that were available during my care of the patient were reviewed by me and considered in my medical decision making (see chart for details).  Patient well-appearing in no acute distress.  Abdominal exam reassuring, no vomiting [facility].  Lab work is unremarkable.  Guaiac exam is negative.  Hemoglobin is normal.  Suspect gastritis related to swallowed blood as patient is asymptomatic and again no further vomiting while in the emergency department.,  Strict return precautions discussed, will start the patient on a PPI she would like to go home  ____________________________________________   FINAL CLINICAL IMPRESSION(S) / ED DIAGNOSES  Final diagnoses:  Acute gastritis with hemorrhage, unspecified gastritis type        Note:  This document was prepared using Dragon voice recognition software and may include unintentional dictation errors.    Jene EveryKinner, Robert, MD 11/22/17 2013           Electronically signed by Jene EveryKinner, Robert, MD at 11/22/2017 8:13 PM     ED on 11/22/2017        Detailed Report

## 2017-12-18 ENCOUNTER — Encounter: Payer: Self-pay | Admitting: *Deleted

## 2017-12-21 ENCOUNTER — Ambulatory Visit: Payer: Medicaid Other

## 2017-12-21 ENCOUNTER — Encounter: Payer: Self-pay | Admitting: *Deleted

## 2017-12-21 ENCOUNTER — Encounter
Admission: RE | Disposition: A | Discharge status: Home / Self Care | Payer: Self-pay | Source: Ambulatory Visit | Attending: General Surgery

## 2017-12-21 ENCOUNTER — Other Ambulatory Visit: Payer: Self-pay

## 2017-12-21 ENCOUNTER — Ambulatory Visit: Payer: Medicaid Other | Admitting: Registered Nurse

## 2017-12-21 ENCOUNTER — Ambulatory Visit
Admission: RE | Admit: 2017-12-21 | Discharge: 2017-12-21 | Disposition: A | Discharge status: Home / Self Care | Payer: Medicaid Other | Source: Ambulatory Visit | Attending: General Surgery | Admitting: General Surgery

## 2017-12-21 DIAGNOSIS — K8010 Calculus of gallbladder with chronic cholecystitis without obstruction: Secondary | ICD-10-CM | POA: Diagnosis not present

## 2017-12-21 DIAGNOSIS — Z79899 Other long term (current) drug therapy: Secondary | ICD-10-CM | POA: Insufficient documentation

## 2017-12-21 DIAGNOSIS — F17210 Nicotine dependence, cigarettes, uncomplicated: Secondary | ICD-10-CM | POA: Insufficient documentation

## 2017-12-21 DIAGNOSIS — K8020 Calculus of gallbladder without cholecystitis without obstruction: Secondary | ICD-10-CM | POA: Diagnosis not present

## 2017-12-21 DIAGNOSIS — Z419 Encounter for procedure for purposes other than remedying health state, unspecified: Secondary | ICD-10-CM

## 2017-12-21 DIAGNOSIS — K219 Gastro-esophageal reflux disease without esophagitis: Secondary | ICD-10-CM | POA: Diagnosis not present

## 2017-12-21 HISTORY — PX: CHOLECYSTECTOMY: SHX55

## 2017-12-21 HISTORY — DX: Opioid dependence, uncomplicated: F11.20

## 2017-12-21 LAB — URINE DRUG SCREEN, QUALITATIVE (ARMC ONLY)
AMPHETAMINES, UR SCREEN: NOT DETECTED
Barbiturates, Ur Screen: NOT DETECTED
Benzodiazepine, Ur Scrn: NOT DETECTED
COCAINE METABOLITE, UR ~~LOC~~: NOT DETECTED
Cannabinoid 50 Ng, Ur ~~LOC~~: NOT DETECTED
MDMA (ECSTASY) UR SCREEN: NOT DETECTED
Methadone Scn, Ur: NOT DETECTED
OPIATE, UR SCREEN: NOT DETECTED
Phencyclidine (PCP) Ur S: NOT DETECTED
Tricyclic, Ur Screen: NOT DETECTED

## 2017-12-21 LAB — POCT PREGNANCY, URINE: Preg Test, Ur: NEGATIVE

## 2017-12-21 SURGERY — LAPAROSCOPIC CHOLECYSTECTOMY WITH INTRAOPERATIVE CHOLANGIOGRAM
Anesthesia: General | Wound class: Clean Contaminated

## 2017-12-21 MED ORDER — KETOROLAC TROMETHAMINE 30 MG/ML IJ SOLN
INTRAMUSCULAR | Status: AC
  Filled 2017-12-21: qty 1

## 2017-12-21 MED ORDER — OXYCODONE HCL 5 MG PO TABS
ORAL_TABLET | ORAL | Status: AC
  Filled 2017-12-21: qty 1

## 2017-12-21 MED ORDER — KETOROLAC TROMETHAMINE 30 MG/ML IJ SOLN
INTRAMUSCULAR | Status: DC | PRN
  Administered 2017-12-21: 30 mg via INTRAVENOUS

## 2017-12-21 MED ORDER — HYDROCODONE-ACETAMINOPHEN 5-325 MG PO TABS: 1.0000 | ORAL_TABLET | ORAL | 0 refills | Status: AC | PRN

## 2017-12-21 MED ORDER — FENTANYL CITRATE (PF) 100 MCG/2ML IJ SOLN
25.0000 ug | INTRAMUSCULAR | Status: DC | PRN
  Administered 2017-12-21 (×5): 25 ug via INTRAVENOUS

## 2017-12-21 MED ORDER — MIDAZOLAM HCL 2 MG/2ML IJ SOLN
INTRAMUSCULAR | Status: AC
  Filled 2017-12-21: qty 2

## 2017-12-21 MED ORDER — FENTANYL CITRATE (PF) 100 MCG/2ML IJ SOLN
INTRAMUSCULAR | Status: AC
  Filled 2017-12-21: qty 2

## 2017-12-21 MED ORDER — LACTATED RINGERS IV SOLN
INTRAVENOUS | Status: DC
  Administered 2017-12-21: 13:00:00 via INTRAVENOUS

## 2017-12-21 MED ORDER — HYDROCODONE-ACETAMINOPHEN 5-325 MG PO TABS: 1.0000 | ORAL_TABLET | Freq: Once | ORAL | Status: DC

## 2017-12-21 MED ORDER — ROCURONIUM BROMIDE 50 MG/5ML IV SOLN
INTRAVENOUS | Status: AC
  Filled 2017-12-21: qty 1

## 2017-12-21 MED ORDER — FENTANYL CITRATE (PF) 100 MCG/2ML IJ SOLN
INTRAMUSCULAR | Status: DC | PRN
  Administered 2017-12-21: 100 ug via INTRAVENOUS
  Administered 2017-12-21: 50 ug via INTRAVENOUS

## 2017-12-21 MED ORDER — ONDANSETRON HCL 4 MG/2ML IJ SOLN
4.0000 mg | Freq: Once | INTRAMUSCULAR | Status: AC | PRN
  Administered 2017-12-21: 4 mg via INTRAVENOUS

## 2017-12-21 MED ORDER — ACETAMINOPHEN 10 MG/ML IV SOLN
INTRAVENOUS | Status: DC | PRN
  Administered 2017-12-21: 1000 mg via INTRAVENOUS

## 2017-12-21 MED ORDER — DEXAMETHASONE SODIUM PHOSPHATE 10 MG/ML IJ SOLN
INTRAMUSCULAR | Status: AC
  Filled 2017-12-21: qty 1

## 2017-12-21 MED ORDER — FENTANYL CITRATE (PF) 100 MCG/2ML IJ SOLN
INTRAMUSCULAR | Status: AC
  Administered 2017-12-21: 25 ug via INTRAVENOUS
  Filled 2017-12-21: qty 2

## 2017-12-21 MED ORDER — ONDANSETRON HCL 4 MG/2ML IJ SOLN
INTRAMUSCULAR | Status: AC
  Filled 2017-12-21: qty 2

## 2017-12-21 MED ORDER — SUGAMMADEX SODIUM 200 MG/2ML IV SOLN
INTRAVENOUS | Status: DC | PRN
  Administered 2017-12-21: 300 mg via INTRAVENOUS

## 2017-12-21 MED ORDER — LIDOCAINE HCL (PF) 2 % IJ SOLN
INTRAMUSCULAR | Status: AC
  Filled 2017-12-21: qty 10

## 2017-12-21 MED ORDER — SODIUM CHLORIDE 0.9 % IV SOLN
INTRAVENOUS | Status: DC | PRN
  Administered 2017-12-21: 20 mL

## 2017-12-21 MED ORDER — DEXAMETHASONE SODIUM PHOSPHATE 10 MG/ML IJ SOLN
INTRAMUSCULAR | Status: DC | PRN
  Administered 2017-12-21: 10 mg via INTRAVENOUS

## 2017-12-21 MED ORDER — SUGAMMADEX SODIUM 200 MG/2ML IV SOLN
INTRAVENOUS | Status: AC
  Filled 2017-12-21: qty 4

## 2017-12-21 MED ORDER — OXYCODONE HCL 5 MG PO TABS
5.0000 mg | ORAL_TABLET | Freq: Once | ORAL | Status: AC
  Administered 2017-12-21: 5 mg via ORAL

## 2017-12-21 MED ORDER — MIDAZOLAM HCL 2 MG/2ML IJ SOLN
INTRAMUSCULAR | Status: DC | PRN
  Administered 2017-12-21: 2 mg via INTRAVENOUS

## 2017-12-21 MED ORDER — ROCURONIUM BROMIDE 100 MG/10ML IV SOLN
INTRAVENOUS | Status: DC | PRN
  Administered 2017-12-21: 20 mg via INTRAVENOUS
  Administered 2017-12-21: 50 mg via INTRAVENOUS

## 2017-12-21 MED ORDER — LIDOCAINE HCL (CARDIAC) 20 MG/ML IV SOLN
INTRAVENOUS | Status: DC | PRN
  Administered 2017-12-21: 100 mg via INTRAVENOUS

## 2017-12-21 MED ORDER — SODIUM CHLORIDE 0.9 % IJ SOLN
INTRAMUSCULAR | Status: AC
  Filled 2017-12-21: qty 50

## 2017-12-21 MED ORDER — ACETAMINOPHEN 10 MG/ML IV SOLN
INTRAVENOUS | Status: AC
  Filled 2017-12-21: qty 100

## 2017-12-21 MED ORDER — PROPOFOL 10 MG/ML IV BOLUS
INTRAVENOUS | Status: DC | PRN
  Administered 2017-12-21: 50 mg via INTRAVENOUS
  Administered 2017-12-21: 150 mg via INTRAVENOUS

## 2017-12-21 MED ORDER — PHENYLEPHRINE HCL 10 MG/ML IJ SOLN
INTRAMUSCULAR | Status: DC | PRN
  Administered 2017-12-21: 100 ug via INTRAVENOUS

## 2017-12-21 MED ORDER — SEVOFLURANE IN SOLN
RESPIRATORY_TRACT | Status: AC
  Filled 2017-12-21: qty 250

## 2017-12-21 MED ORDER — PROPOFOL 10 MG/ML IV BOLUS
INTRAVENOUS | Status: AC
  Filled 2017-12-21: qty 20

## 2017-12-21 SURGICAL SUPPLY — 40 items
APPLIER CLIP ROT 10 11.4 M/L (STAPLE) ×3
BLADE SURG 11 STRL SS SAFETY (MISCELLANEOUS) ×3 IMPLANT
CANISTER SUCT 1200ML W/VALVE (MISCELLANEOUS) ×3 IMPLANT
CANNULA DILATOR  5MM W/SLV (CANNULA) ×2
CANNULA DILATOR 10 W/SLV (CANNULA) ×2 IMPLANT
CANNULA DILATOR 10MM W/SLV (CANNULA) ×1
CANNULA DILATOR 5 W/SLV (CANNULA) ×4 IMPLANT
CATH CHOLANG 76X19 KUMAR (CATHETERS) ×3 IMPLANT
CHLORAPREP W/TINT 26ML (MISCELLANEOUS) ×3 IMPLANT
CLIP APPLIE ROT 10 11.4 M/L (STAPLE) ×1 IMPLANT
CLOSURE WOUND 1/2 X4 (GAUZE/BANDAGES/DRESSINGS) ×1
CONRAY 60ML FOR OR (MISCELLANEOUS) ×3 IMPLANT
DISSECTOR KITTNER STICK (MISCELLANEOUS) ×1 IMPLANT
DISSECTORS/KITTNER STICK (MISCELLANEOUS) ×3
DRAPE SHEET LG 3/4 BI-LAMINATE (DRAPES) ×3 IMPLANT
DRSG TEGADERM 2-3/8X2-3/4 SM (GAUZE/BANDAGES/DRESSINGS) ×12 IMPLANT
DRSG TELFA 4X3 1S NADH ST (GAUZE/BANDAGES/DRESSINGS) ×3 IMPLANT
ELECT REM PT RETURN 9FT ADLT (ELECTROSURGICAL) ×3
ELECTRODE REM PT RTRN 9FT ADLT (ELECTROSURGICAL) ×1 IMPLANT
GLOVE BIO SURGEON STRL SZ7.5 (GLOVE) ×12 IMPLANT
GLOVE INDICATOR 8.0 STRL GRN (GLOVE) ×9 IMPLANT
GOWN STRL REUS W/ TWL LRG LVL3 (GOWN DISPOSABLE) ×3 IMPLANT
GOWN STRL REUS W/TWL LRG LVL3 (GOWN DISPOSABLE) ×6
IRRIGATION STRYKERFLOW (MISCELLANEOUS) ×1 IMPLANT
IRRIGATOR STRYKERFLOW (MISCELLANEOUS) ×3
IV LACTATED RINGERS 1000ML (IV SOLUTION) ×3 IMPLANT
KIT TURNOVER KIT A (KITS) ×3 IMPLANT
LABEL OR SOLS (LABEL) ×3 IMPLANT
NDL INSUFF ACCESS 14 VERSASTEP (NEEDLE) ×3 IMPLANT
NS IRRIG 500ML POUR BTL (IV SOLUTION) ×3 IMPLANT
PACK LAP CHOLECYSTECTOMY (MISCELLANEOUS) ×3 IMPLANT
POUCH SPECIMEN RETRIEVAL 10MM (ENDOMECHANICALS) ×3 IMPLANT
SCISSORS METZENBAUM CVD 33 (INSTRUMENTS) ×3 IMPLANT
STRIP CLOSURE SKIN 1/2X4 (GAUZE/BANDAGES/DRESSINGS) ×2 IMPLANT
SUT VIC AB 0 CT2 27 (SUTURE) ×3 IMPLANT
SUT VIC AB 4-0 FS2 27 (SUTURE) ×3 IMPLANT
SWABSTK COMLB BENZOIN TINCTURE (MISCELLANEOUS) ×3 IMPLANT
TROCAR XCEL NON-BLD 11X100MML (ENDOMECHANICALS) ×3 IMPLANT
TUBING INSUFFLATION (TUBING) ×3 IMPLANT
WATER STERILE IRR 1000ML POUR (IV SOLUTION) IMPLANT

## 2017-12-21 NOTE — Anesthesia Procedure Notes (Signed)

## 2017-12-21 NOTE — Anesthesia Preprocedure Evaluation (Addendum)
Anesthesia Evaluation  Patient identified by MRN, date of birth, ID band Patient awake    Reviewed: Allergy & Precautions, NPO status , Patient's Chart, lab work & pertinent test results  History of Anesthesia Complications Negative for: history of anesthetic complications  Airway Mallampati: II       Dental  (+) Partial Upper   Pulmonary neg sleep apnea, neg COPD, Current Smoker,           Cardiovascular (-) hypertension(-) Past MI and (-) CHF (-) dysrhythmias (-) Valvular Problems/Murmurs     Neuro/Psych neg Seizures    GI/Hepatic Neg liver ROS, GERD  Medicated and Controlled,(+)     substance abuse  ,   Endo/Other  negative endocrine ROS  Renal/GU negative Renal ROS     Musculoskeletal   Abdominal   Peds  Hematology   Anesthesia Other Findings   Reproductive/Obstetrics                            Anesthesia Physical Anesthesia Plan  ASA: II  Anesthesia Plan: General   Post-op Pain Management:    Induction: Intravenous  PONV Risk Score and Plan: 2 and Dexamethasone and Ondansetron  Airway Management Planned: Oral ETT  Additional Equipment:   Intra-op Plan:   Post-operative Plan:   Informed Consent: I have reviewed the patients History and Physical, chart, labs and discussed the procedure including the risks, benefits and alternatives for the proposed anesthesia with the patient or authorized representative who has indicated his/her understanding and acceptance.     Plan Discussed with:   Anesthesia Plan Comments:         Anesthesia Quick Evaluation

## 2017-12-21 NOTE — Anesthesia Post-op Follow-up Note (Signed)
Anesthesia QCDR form completed.        

## 2017-12-21 NOTE — Discharge Instructions (Signed)

## 2017-12-21 NOTE — H&P (Signed)
No change in clinical history or exam. For cholecystectomy.  

## 2017-12-21 NOTE — Anesthesia Postprocedure Evaluation (Signed)
Anesthesia Post Note  Patient: Selena Nelson  Procedure(s) Performed: LAPAROSCOPIC CHOLECYSTECTOMY WITH INTRAOPERATIVE CHOLANGIOGRAM (N/A )  Patient location during evaluation: PACU Anesthesia Type: General Level of consciousness: awake and alert Pain management: pain level controlled Vital Signs Assessment: post-procedure vital signs reviewed and stable Respiratory status: spontaneous breathing and respiratory function stable Cardiovascular status: stable Anesthetic complications: no     Last Vitals:  Vitals:   12/21/17 1250 12/21/17 1444  BP: 116/75 117/64  Pulse: 73 80  Resp: 16 16  Temp: 36.6 C (!) 36.3 C  SpO2: 98% 100%    Last Pain:  Vitals:   12/21/17 1444  TempSrc: Temporal  PainSc: Asleep                 KEPHART,WILLIAM K

## 2017-12-21 NOTE — Transfer of Care (Signed)
Immediate Anesthesia Transfer of Care Note  Patient: Selena Nelson  Procedure(s) Performed: Procedure(s): LAPAROSCOPIC CHOLECYSTECTOMY WITH INTRAOPERATIVE CHOLANGIOGRAM (N/A)  Patient Location: PACU  Anesthesia Type:General  Level of Consciousness: sedated  Airway & Oxygen Therapy: Patient Spontanous Breathing and Patient connected to face mask oxygen  Post-op Assessment: Report given to RN and Post -op Vital signs reviewed and stable  Post vital signs: Reviewed and stable  Last Vitals:  Vitals:   12/21/17 1250 12/21/17 1444  BP: 116/75 117/64  Pulse: 73 80  Resp: 16 16  Temp: 36.6 C (!) 36.3 C  SpO2: 98% 100%    Complications: No apparent anesthesia complications

## 2017-12-21 NOTE — OR Nursing (Signed)
Discussed Norco and pt advising of itching with same with Dr. Lemar LivingsByrnett via tele prior to d/c.  Pt. Advised to take benadryl if needs Norco and itching occurs.

## 2017-12-21 NOTE — Op Note (Signed)
Preoperative diagnosis: Chronic cholecystitis and cholelithiasis.  Postoperative diagnosis: Same.  Operative procedure: Laparoscopic cholecystectomy with intraoperative cholangiograms.  Operating Surgeon: Donnalee CurryJeffrey Jeralyn Nolden, MD.  Anesthesia: General endotracheal.  Estimated blood loss: Less than 5 cc.  Clinical note: This 27 year old woman has had recurrent upper abdominal pain and ultrasound showed evidence of cholelithiasis.  Based on her young age and symptoms she was felt to be a candidate for elective cholecystectomy.  Operative note: With the patient under adequate general endotracheal anesthesia the abdomen was cleansed with ChloraPrep and draped.  In Trendelenburg position a varies needle was placed through a trans-umbilical incision.  After assuring intra-abdominal location with a hanging drop test the abdomen was insufflated with CO2 at 10 mmHg pressure.  A 10 mm Step port was expanded.  Inspection showed no evidence of injury from initial port placement.  The patient was placed into reverse Trendelenburg position and rolled to the left.  An 11 mm XL port was placed in the epigastrium under direct vision.  2-5 mm Step ports were placed in the right lower abdominal wall.  The gallbladder was found to have a band of adhesions from the omentum from the fundus down to the neck.  These were taken down with cautery dissection and sharp dissection.  The neck of the gallbladder was cleared.  A Kumar clamp was placed and fluoroscopic cholangiograms completed using 20 cc of one half strength Conray 60.  There was prompt filling of the right and left hepatic ducts and free flow into the duodenum.  The midportion of the hepatic duct was not visualized due to the need to change the C arm position during imaging.  No evidence of retained stones.  The cystic duct and cystic artery were doubly clipped and divided.  The gallbladder was then removed from the liver bed making use of hook cautery dissection.  It  was delivered to the umbilical port site.  Inspection from the epigastric site showed no evidence of injury from initial port placement.  The right upper quadrant was inspected and good hemostasis was noted.  The abdomen was then desufflated and ports removed under direct vision.  The fascia at the umbilicus was closed with a 0 Vicryl figure-of-eight suture.  Skin incisions were closed with 4-0 Vicryl subcuticular sutures.  Benzoin, Steri-Strips, Telfa and Tegaderm dressings were applied.  The patient tolerated the procedure well and was taken to recovery room in stable condition.

## 2017-12-21 NOTE — OR Nursing (Signed)
Per Dr. Byrnett via tLemar Livingsele @ [phone], ok to d/c pt to home without his visit to postop

## 2017-12-22 ENCOUNTER — Encounter: Payer: Self-pay | Admitting: General Surgery

## 2017-12-22 MED ORDER — ONDANSETRON HCL 4 MG/2ML IJ SOLN
INTRAMUSCULAR | Status: AC
  Filled 2017-12-22: qty 2

## 2017-12-23 LAB — SURGICAL PATHOLOGY

## 2018-01-05 ENCOUNTER — Ambulatory Visit: Payer: Medicaid Other | Admitting: General Surgery

## 2018-01-07 ENCOUNTER — Ambulatory Visit (INDEPENDENT_AMBULATORY_CARE_PROVIDER_SITE_OTHER): Payer: Medicaid Other | Admitting: General Surgery

## 2018-01-07 ENCOUNTER — Encounter: Payer: Self-pay | Admitting: General Surgery

## 2018-01-07 VITALS — BP 110/66 | HR 70 | Resp 12 | Ht 64.0 in | Wt 153.0 lb

## 2018-01-07 DIAGNOSIS — K8020 Calculus of gallbladder without cholecystitis without obstruction: Secondary | ICD-10-CM

## 2018-01-07 NOTE — Patient Instructions (Signed)
  Return as needed. Proper lifting techniques reviewed. The patient is aware to call back for any questions or concerns.    

## 2018-01-07 NOTE — Progress Notes (Signed)
Patient ID: Selena Nelson, female   DOB: 02/20/1991, 27 y.o.   MRN: [id number]  Chief Complaint  Patient presents with  . Routine Post Op    HPI Selena Nelson is a 27 y.o. female here today for her post op cholecystectomy done on 12/21/2017. Patient states she is doing well.  The patient reports normal bowel function.  Soreness improved with use of a heating pad after surgery.  Minimal need for additional narcotics.  HPI  Past Medical History:  Diagnosis Date  . GERD (gastroesophageal reflux disease)   . History of kidney stones    CURRENTLY  . Opiate addiction (HCC)    pt taking Suboxone daily    Past Surgical History:  Procedure Laterality Date  . CHOLECYSTECTOMY N/A 12/21/2017   Procedure: LAPAROSCOPIC CHOLECYSTECTOMY WITH INTRAOPERATIVE CHOLANGIOGRAM;  Surgeon: Earline MayotteByrnett, Leandrea Ackley W, MD;  Location: [facility] ORS;  Service: General;  Laterality: N/A;  . MOUTH SURGERY  11/2017   JUST HAD SOME UPPER TEETH PULLED AND NOW HAS A PARTIAL PLATE  . TONSILLECTOMY AND ADENOIDECTOMY  1996  . WISDOM TOOTH EXTRACTION      Family History  Problem Relation Age of Onset  . Breast cancer Maternal Grandmother     Social History Social History   Tobacco Use  . Smoking status: Current Every Day Smoker    Packs/day: 0.50    Years: 2.00    Pack years: 1.00    Types: Cigarettes  . Smokeless tobacco: Never Used  Substance Use Topics  . Alcohol use: No    Frequency: Never  . Drug use: Not Currently    Types: Other-see comments    Comment:  history of opioid abuse    Allergies  Allergen Reactions  . Amoxicillin Other (See Comments)    Has patient had a PCN reaction causing immediate rash, facial/tongue/throat swelling, SOB or lightheadedness with hypotension: Yes Has patient had a PCN reaction causing severe rash involving mucus membranes or skin necrosis: Yes Has patient had a PCN reaction that required hospitalization: Yes Has patient had a PCN reaction occurring within the last 10  years: Yes If all of the above answers are "NO", then may proceed with Cephalosporin use.   Marland Kitchen. Penicillins Other (See Comments)    Has patient had a PCN reaction causing immediate rash, facial/tongue/throat swelling, SOB or lightheadedness with hypotension: Yes Has patient had a PCN reaction causing severe rash involving mucus membranes or skin necrosis: Yes Has patient had a PCN reaction that required hospitalization: Yes Has patient had a PCN reaction occurring within the last 10 years: Yes If all of the above answers are "NO", then may proceed with Cephalosporin use.     Current Outpatient Medications  Medication Sig Dispense Refill  . Buprenorphine HCl-Naloxone HCl (SUBOXONE SL) Place 1 tablet under the tongue every morning.     . DULoxetine (CYMBALTA) 30 MG capsule Take 90 mg by mouth every morning.     . etonogestrel (NEXPLANON) 68 MG IMPL implant 1 each by Subdermal route once.    Marland Kitchen. HYDROcodone-acetaminophen (NORCO/VICODIN) 5-325 MG tablet Take 1 tablet by mouth every 4 (four) hours as needed for moderate pain. 10 tablet 0  . omeprazole (PRILOSEC) 40 MG capsule Take 1 capsule (40 mg total) by mouth daily. 30 capsule 1   No current facility-administered medications for this visit.     Review of Systems Review of Systems  Constitutional: Negative.   Respiratory: Negative.     Blood pressure 110/66, pulse 70, resp. rate 12, height  5\' 4"  (1.626 m), weight 153 lb (69.4 kg), unknown if currently breastfeeding.  Physical Exam Physical Exam  Constitutional: She is oriented to person, place, and time. She appears well-developed and well-nourished.  Cardiovascular: Normal rate, regular rhythm and intact distal pulses.  Pulmonary/Chest: Effort normal and breath sounds normal.  Abdominal: Soft. Bowel sounds are normal. There is no tenderness.  Port sites are clean and healing well.   Neurological: She is alert and oriented to person, place, and time.  Skin: Skin is warm and dry.     Data Reviewed DIAGNOSIS:  A. GALLBLADDER; CHOLECYSTECTOMY:  - CHOLELITHIASIS AND CHRONIC CHOLECYSTITIS.  - NEGATIVE FOR DYSPLASIA AND MALIGNANCY.   Assessment    Doing well post cholecystectomy.    Plan Patient to return on an as-needed basis.  She may return to work at any time.  Proper lifting technique reviewed. HPI, Physical Exam, Assessment and Plan have been scribed under the direction and in the presence of Donnalee Curry, MD.  Ples Specter, CMA   I have completed the exam and reviewed the above documentation for accuracy and completeness.  I agree with the above.  Museum/gallery conservator has been used and any errors in dictation or transcription are unintentional.  Donnalee Curry, M.D., F.A.C.S.  Merrily Pew Sammie Denner 01/07/2018, 9:49 AM

## 2018-01-12 ENCOUNTER — Encounter: Payer: Self-pay | Admitting: *Deleted

## 2018-04-11 IMAGING — XA DG CHOLANGIOGRAM OPERATIVE
2 series · 6 of 6 positions shown · non-contrast
Comparison: Prior abdominal ultrasound 10/02/2016

CLINICAL DATA: 27-year-old female undergoing laparoscopic
cholecystectomy

EXAM:
INTRAOPERATIVE CHOLANGIOGRAM
TECHNIQUE: Cholangiographic images from the C-arm fluoroscopic device were
submitted for interpretation post-operatively. Please see the
procedural report for the amount of contrast and the fluoroscopy
time utilized.

[Series 6: cont. · 3 of 7 frames shown (1 of 2)]
[frame 2/7]
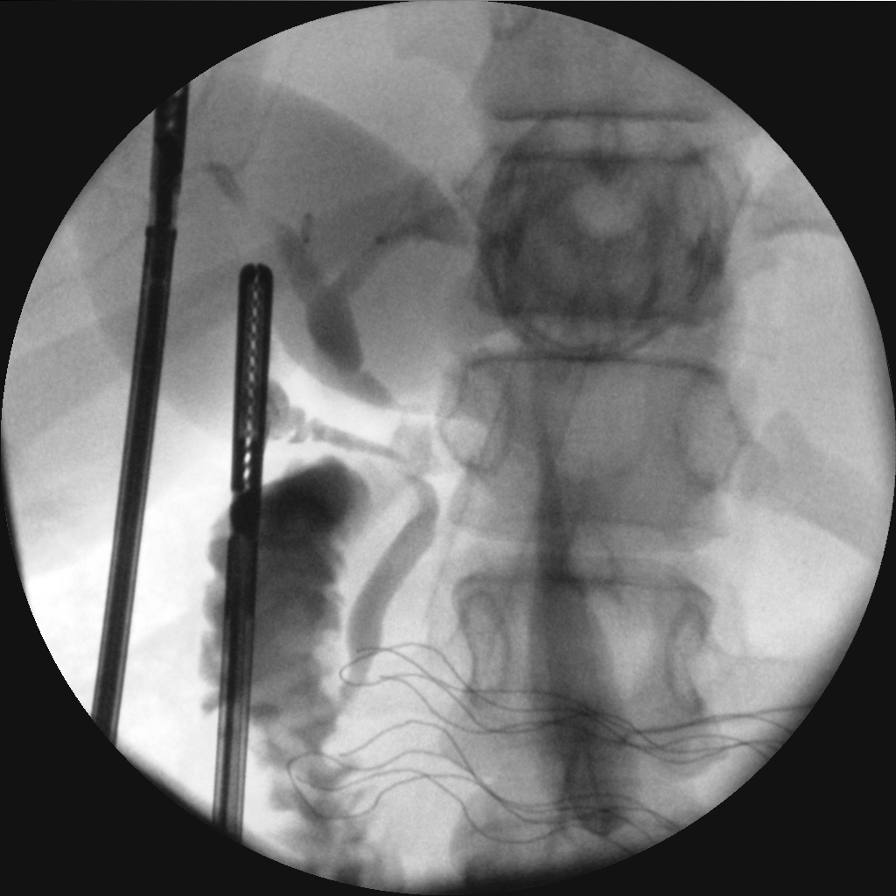
[frame 4/7]
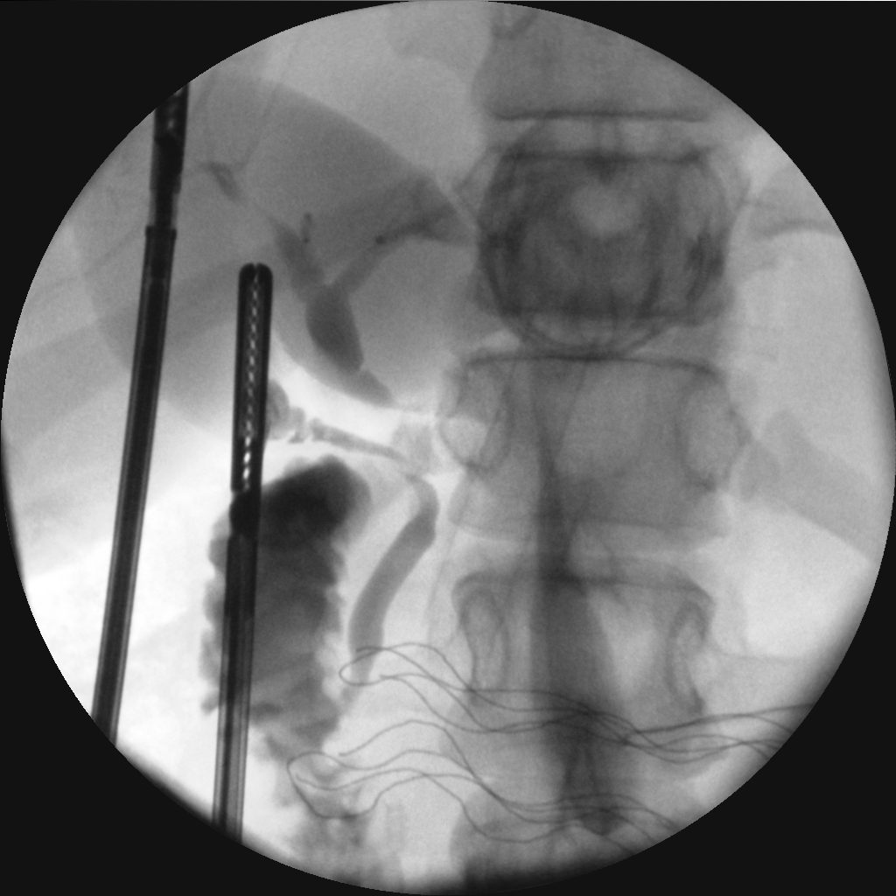
[frame 6/7]
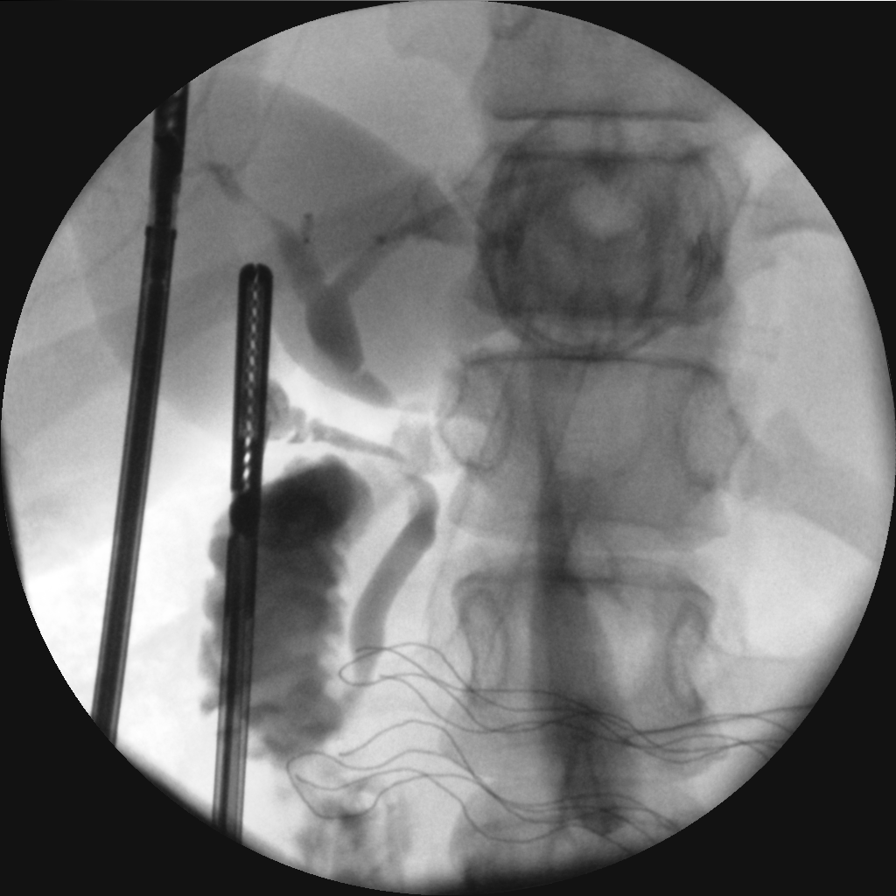

[Series 7: cont. · 3 of 4 frames shown (2 of 2)]
[frame 1/4]
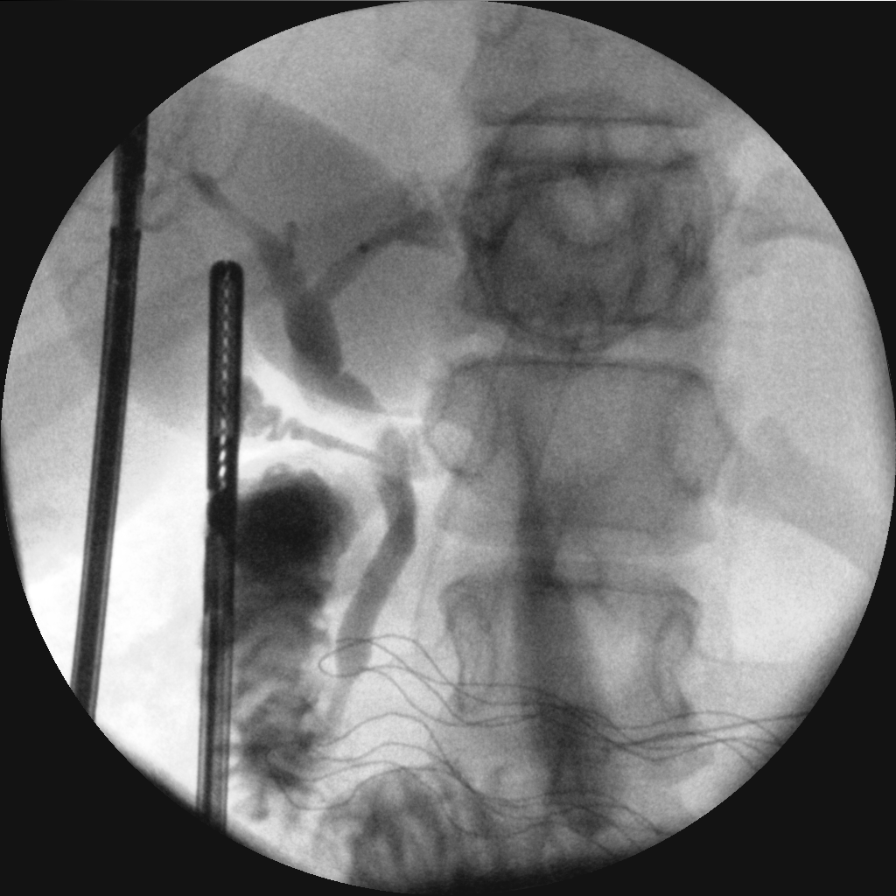
[frame 3/4]
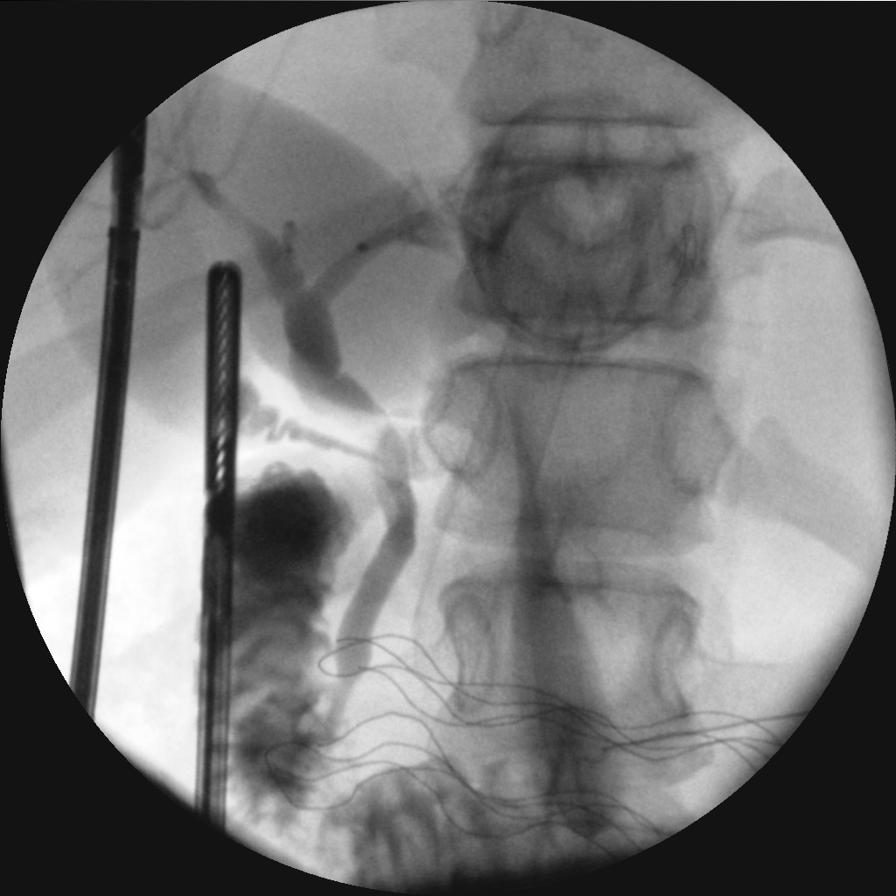
[frame 4/4]
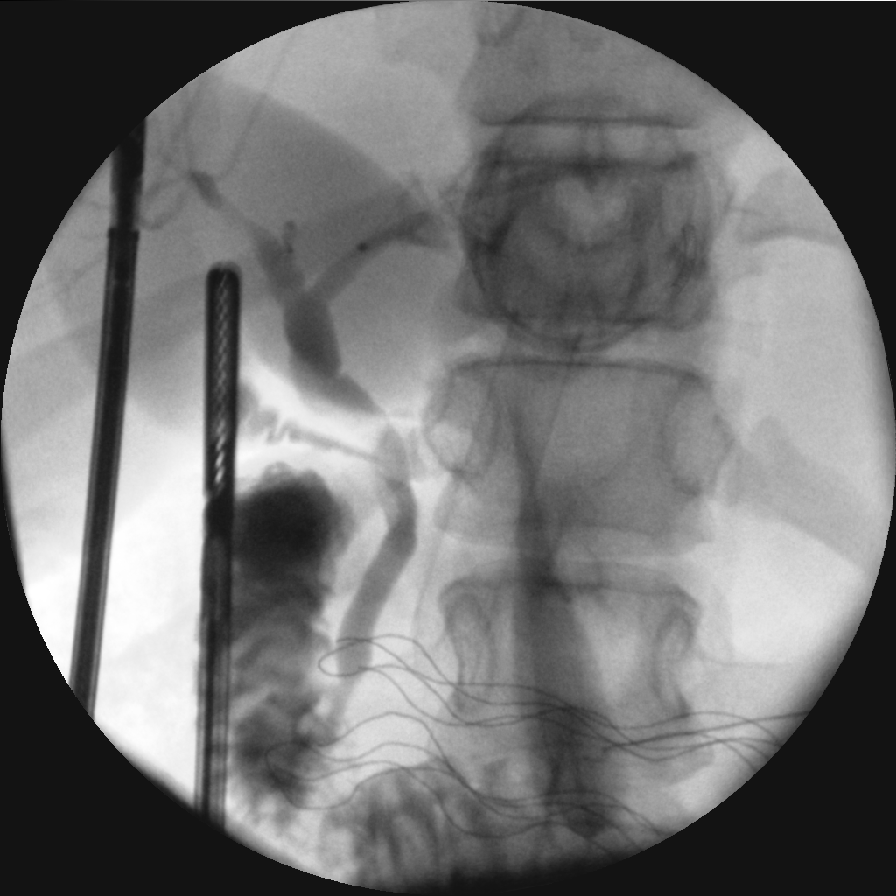

[6 of 6 positions shown; findings below may reference images not displayed]

FINDINGS: Cine clips obtained during intraoperative cholangiogram at the time
of laparoscopic cholecystectomy are submitted for review. The images
demonstrate cannulation of the gallbladder fundus and opacification
of the biliary tree. No evidence of biliary duct dilatation,
stenosis, stricture or choledocholithiasis. Contrast passes through
the ampulla and into the duodenum.
IMPRESSION: Negative intraoperative cholangiogram.

## 2018-05-30 ENCOUNTER — Encounter: Payer: Self-pay | Admitting: Emergency Medicine

## 2018-05-30 ENCOUNTER — Emergency Department
Admission: EM | Admit: 2018-05-30 | Discharge: 2018-05-31 | Disposition: A | Discharge status: Home / Self Care | Payer: Medicaid Other | Attending: Emergency Medicine | Admitting: Emergency Medicine

## 2018-05-30 DIAGNOSIS — Z79899 Other long term (current) drug therapy: Secondary | ICD-10-CM | POA: Insufficient documentation

## 2018-05-30 DIAGNOSIS — F17210 Nicotine dependence, cigarettes, uncomplicated: Secondary | ICD-10-CM | POA: Insufficient documentation

## 2018-05-30 DIAGNOSIS — R112 Nausea with vomiting, unspecified: Secondary | ICD-10-CM

## 2018-05-30 DIAGNOSIS — T5191XA Toxic effect of unspecified alcohol, accidental (unintentional), initial encounter: Secondary | ICD-10-CM | POA: Insufficient documentation

## 2018-05-30 LAB — COMPREHENSIVE METABOLIC PANEL
ALBUMIN: 4.7 g/dL (ref 3.5–5.0)
ALT: 16 U/L (ref 0–44)
AST: 18 U/L (ref 15–41)
Alkaline Phosphatase: 72 U/L (ref 38–126)
Anion gap: 10 (ref 5–15)
BUN: 9 mg/dL (ref 6–20)
CO2: 27 mmol/L (ref 22–32)
Calcium: 9.8 mg/dL (ref 8.9–10.3)
Chloride: 104 mmol/L (ref 98–111)
Creatinine, Ser: 0.73 mg/dL (ref 0.44–1.00)
GFR calc Af Amer: >60 mL/min (ref >60)
Glucose, Bld: 118 mg/dL — ABNORMAL HIGH (ref 70–99)
POTASSIUM: 3.7 mmol/L (ref 3.5–5.1)
Sodium: 141 mmol/L (ref 135–145)
Total Bilirubin: 1.2 mg/dL (ref 0.3–1.2)
Total Protein: 8.6 g/dL — ABNORMAL HIGH (ref 6.5–8.1)

## 2018-05-30 LAB — CBC
HEMATOCRIT: 42.1 % (ref 35.0–47.0)
Hemoglobin: 14.6 g/dL (ref 12.0–16.0)
MCH: 32 pg (ref 26.0–34.0)
MCHC: 34.7 g/dL (ref 32.0–36.0)
MCV: 92.1 fL (ref 80.0–100.0)
Platelets: 278 10*3/uL (ref 150–440)
RBC: 4.57 MIL/uL (ref 3.80–5.20)
RDW: 13.2 % (ref 11.5–14.5)
WBC: 13.8 10*3/uL — AB (ref 3.6–11.0)

## 2018-05-30 LAB — HCG, QUANTITATIVE, PREGNANCY: hCG, Beta Chain, Quant, S: <1 m[IU]/mL (ref <5)

## 2018-05-30 LAB — LIPASE, BLOOD: Lipase: 18 U/L (ref 11–51)

## 2018-05-30 LAB — ETHANOL

## 2018-05-30 LAB — POCT PREGNANCY, URINE: Preg Test, Ur: NEGATIVE

## 2018-05-30 LAB — BETA-HYDROXYBUTYRIC ACID: Beta-Hydroxybutyric Acid: 0.29 mmol/L — ABNORMAL HIGH (ref 0.05–0.27)

## 2018-05-30 MED ORDER — ONDANSETRON HCL 4 MG PO TABS: 4.0000 mg | ORAL_TABLET | Freq: Every day | ORAL | 0 refills | Status: DC | PRN

## 2018-05-30 MED ORDER — SUCRALFATE 1 G PO TABS
1.0000 g | ORAL_TABLET | Freq: Once | ORAL | Status: AC
  Administered 2018-05-30: 1 g via ORAL
  Filled 2018-05-30: qty 1

## 2018-05-30 MED ORDER — SODIUM CHLORIDE 0.9 % IV BOLUS
1000.0000 mL | Freq: Once | INTRAVENOUS | Status: AC
  Administered 2018-05-30: 1000 mL via INTRAVENOUS

## 2018-05-30 MED ORDER — PROCHLORPERAZINE EDISYLATE 10 MG/2ML IJ SOLN
10.0000 mg | Freq: Once | INTRAMUSCULAR | Status: AC
  Administered 2018-05-30: 10 mg via INTRAVENOUS
  Filled 2018-05-30: qty 2

## 2018-05-30 MED ORDER — ONDANSETRON HCL 4 MG/2ML IJ SOLN
4.0000 mg | Freq: Once | INTRAMUSCULAR | Status: AC | PRN
  Administered 2018-05-30: 4 mg via INTRAVENOUS
  Filled 2018-05-30: qty 2

## 2018-05-30 MED ORDER — GI COCKTAIL ~~LOC~~
30.0000 mL | Freq: Once | ORAL | Status: AC
  Administered 2018-05-30: 30 mL via ORAL
  Filled 2018-05-30: qty 30

## 2018-05-30 NOTE — ED Provider Notes (Signed)
[facility] Emergency Department Provider Note  ____________________________________________   First MD Initiated Contact with Patient 05/30/18 2302     (approximate)  I have reviewed the triage vital signs and the nursing notes.   HISTORY  Chief Complaint Emesis    HPI Selena Nelson is a 27 y.o. female self presents the emergency department with abdominal cramping nausea and vomiting that began today when she woke up.   She had not drunk alcohol in several years and last night she drank 2 juice glasses full of June and orange juice and when she awoke this morning she could not stop vomiting.  She is had difficulty keeping any food down.  She has epigastric burning pain radiating up into her mouth.  Pain is moderate to severe.  Worse when lying flat improves when sitting up.  Past Medical History:  Diagnosis Date  . GERD (gastroesophageal reflux disease)   . History of kidney stones    CURRENTLY  . Opiate addiction (HCC)    pt taking Suboxone daily    Patient Active Problem List   Diagnosis Date Noted  . Gallbladder stone without cholecystitis or obstruction 12/15/2017    Past Surgical History:  Procedure Laterality Date  . CHOLECYSTECTOMY N/A 12/21/2017   Procedure: LAPAROSCOPIC CHOLECYSTECTOMY WITH INTRAOPERATIVE CHOLANGIOGRAM;  Surgeon: Earline Mayotte, MD;  Location: [facility] ORS;  Service: General;  Laterality: N/A;  . MOUTH SURGERY  11/2017   JUST HAD SOME UPPER TEETH PULLED AND NOW HAS A PARTIAL PLATE  . TONSILLECTOMY AND ADENOIDECTOMY  1996  . WISDOM TOOTH EXTRACTION      Prior to Admission medications   Medication Sig Start Date End Date Taking? Authorizing Provider  Buprenorphine HCl-Naloxone HCl (SUBOXONE SL) Place 1 tablet under the tongue every morning.     [provider]  DULoxetine (CYMBALTA) 30 MG capsule Take 90 mg by mouth every morning.     [provider]  etonogestrel (NEXPLANON) 68 MG IMPL implant 1  each by Subdermal route once.    [provider]  HYDROcodone-acetaminophen (NORCO/VICODIN) 5-325 MG tablet Take 1 tablet by mouth every 4 (four) hours as needed for moderate pain. 12/21/17 12/21/18  Earline Mayotte, MD  omeprazole (PRILOSEC) 40 MG capsule Take 1 capsule (40 mg total) by mouth daily. 11/22/17 11/22/18  Jene Every, MD  ondansetron (ZOFRAN) 4 MG tablet Take 1 tablet (4 mg total) by mouth daily as needed. 05/30/18 05/30/19  Merrily Brittle, MD    Allergies Amoxicillin and Penicillins  Family History  Problem Relation Age of Onset  . Breast cancer Maternal Grandmother     Social History Social History   Tobacco Use  . Smoking status: Current Every Day Smoker    Packs/day: 0.50    Years: 2.00    Pack years: 1.00    Types: Cigarettes  . Smokeless tobacco: Never Used  Substance Use Topics  . Alcohol use: No    Frequency: Never  . Drug use: Not Currently    Types: Other-see comments    Comment:  history of opioid abuse    Review of Systems Constitutional: No fever/chills Eyes: No visual changes. ENT: Positive for sore throat. Cardiovascular: Positive for chest pain. Respiratory: Denies shortness of breath. Gastrointestinal: Positive for abdominal pain.  Positive for nausea, positive for vomiting.  No diarrhea.  No constipation. Genitourinary: Negative for dysuria. Musculoskeletal: Negative for back pain. Skin: Negative for rash. Neurological: Negative for headaches, focal weakness or numbness.   ____________________________________________  PHYSICAL EXAM:  VITAL SIGNS: ED Triage Vitals  Enc Vitals Group     BP 05/30/18 2227 126/84     Pulse Rate 05/30/18 2227 88     Resp 05/30/18 2227 20     Temp 05/30/18 2227 98.2 F (36.8 C)     Temp Source 05/30/18 2227 Oral     SpO2 05/30/18 2227 96 %     Weight 05/30/18 2232 145 lb (65.8 kg)     Height 05/30/18 2232 5\' 3"  (1.6 m)     Head Circumference --      Peak Flow --      Pain Score 05/30/18  2238 4     Pain Loc --      Pain Edu? --      Excl. in GC? --     Constitutional: Alert and oriented x4 tearful and uncomfortable appearing nontoxic no diaphoresis speaks in full clear sentences Eyes: PERRL EOMI. Head: Atraumatic. Nose: No congestion/rhinnorhea. Mouth/Throat: No trismus Neck: No stridor.   Cardiovascular: Tachycardic rate, regular rhythm. Grossly normal heart sounds.  Good peripheral circulation. Respiratory: Increased respiratory effort.  No retractions. Lungs CTAB and moving good air Gastrointestinal: Soft diffuse tenderness of the no focality no rebound or guarding no peritonitis Musculoskeletal: No lower extremity edema   Neurologic:  Normal speech and language. No gross focal neurologic deficits are appreciated. Skin:  Skin is warm, dry and intact. No rash noted. Psychiatric: Mood and affect are normal. Speech and behavior are normal.    ____________________________________________   DIFFERENTIAL includes but not limited to  Alcohol poisoning, dehydration, alcohol intoxication, gastritis starvation ____________________________________________   LABS (all labs ordered are listed, but only abnormal results are displayed)  Labs Reviewed  COMPREHENSIVE METABOLIC PANEL - Abnormal; Notable for the following components:      Result Value   Glucose, Bld 118 (*)    Total Protein 8.6 (*)    All other components within normal limits  CBC - Abnormal; Notable for the following components:   WBC 13.8 (*)    All other components within normal limits  BETA-HYDROXYBUTYRIC ACID - Abnormal; Notable for the following components:   Beta-Hydroxybutyric Acid 0.29 (*)    All other components within normal limits  LIPASE, BLOOD  HCG, QUANTITATIVE, PREGNANCY  ETHANOL  POCT PREGNANCY, URINE    Lab work reviewed by me shows elevated white count which is consistent with pain and stress.  Normal BUN suggesting against GI bleed and normal anion gap and beta hydroxybutyric  acid go against starvation __________________________________________  EKG   ____________________________________________  RADIOLOGY   ____________________________________________   PROCEDURES  Procedure(s) performed: no  Procedures  Critical Care performed: no  ____________________________________________   INITIAL IMPRESSION / ASSESSMENT AND PLAN / ED COURSE  Pertinent labs & imaging results that were available during my care of the patient were reviewed by me and considered in my medical decision making (see chart for details).   As part of my medical decision making, I reviewed the following data within the electronic MEDICAL RECORD NUMBER History obtained from family if available, nursing notes, old chart and ekg, as well as notes from prior ED visits.  After the patient received 4 mg of ondansetron in triage her symptoms are essentially unchanged.  We will continue IV fluids and as she does have a ride home we will give her 10 mg of prochlorperazine along with a GI cocktail and Carafate to settle down her stomach.     The patient's pain is  significantly improved and she is able to eat and drink.  She is discharged home in improved condition. ____________________________________________   FINAL CLINICAL IMPRESSION(S) / ED DIAGNOSES  Final diagnoses:  Accidental poisoning by alcohol, initial encounter  Non-intractable vomiting with nausea, unspecified vomiting type      NEW MEDICATIONS STARTED DURING THIS VISIT:  Discharge Medication List as of 05/31/2018 12:04 AM    START taking these medications   Details  ondansetron (ZOFRAN) 4 MG tablet Take 1 tablet (4 mg total) by mouth daily as needed., Starting Sun 05/30/2018, Until Mon 05/30/2019, Print         Note:  This document was prepared using Dragon voice recognition software and may include unintentional dictation errors.     Merrily Brittle, MD 06/04/18 [phone]

## 2018-05-30 NOTE — ED Notes (Signed)
ED Provider at bedside. 

## 2018-05-30 NOTE — ED Triage Notes (Signed)
Pt reports that she has not drank alcohol in years and last pm she drank liquor and today she has not been able to stop vomiting. Pt reports now has stomach cramps as well.

## 2018-05-31 MED ORDER — ONDANSETRON HCL 4 MG/2ML IJ SOLN
INTRAMUSCULAR | Status: AC
  Filled 2018-05-31: qty 2

## 2018-05-31 MED ORDER — ONDANSETRON HCL 4 MG/2ML IJ SOLN
4.0000 mg | Freq: Once | INTRAMUSCULAR | Status: AC
  Administered 2018-05-31: 4 mg via INTRAVENOUS

## 2018-05-31 NOTE — Discharge Instructions (Addendum)
Please never binge drink alcohol like that ever again.  It will make you feel sick to her stomach and make it difficult to keep down food.  Make sure you remain well-hydrated for the next 24 to 48 hours and follow-up with your primary care physician as needed.    Congratulations on your sobriety from opioids - relapse isn't failure.  Please check out [employer] [employer] www.smartrecovery.org - it works better than NA for a lot of people and most meetings are online.  It was a pleasure to take care of you today, and thank you for coming to our emergency department.  If you have any questions or concerns before leaving please ask the nurse to grab me and I'm more than happy to go through your aftercare instructions again.  If you were prescribed any opioid pain medication today such as Norco, Vicodin, Percocet, morphine, hydrocodone, or oxycodone please make sure you do not drive when you are taking this medication as it can alter your ability to drive safely.  If you have any concerns once you are home that you are not improving or are in fact getting worse before you can make it to your follow-up appointment, please do not hesitate to call 911 and come back for further evaluation.  Merrily Brittle, MD  Results for orders placed or performed during the hospital encounter of 05/30/18  Lipase, blood  Result Value Ref Range   Lipase 18 11 - 51 U/L  Comprehensive metabolic panel  Result Value Ref Range   Sodium 141 135 - 145 mmol/L   Potassium 3.7 3.5 - 5.1 mmol/L   Chloride 104 98 - 111 mmol/L   CO2 27 22 - 32 mmol/L   Glucose, Bld 118 (H) 70 - 99 mg/dL   BUN 9 6 - 20 mg/dL   Creatinine, Ser 1.79 0.44 - 1.00 mg/dL   Calcium 9.8 8.9 - 15.0 mg/dL   Total Protein 8.6 (H) 6.5 - 8.1 g/dL   Albumin 4.7 3.5 - 5.0 g/dL   AST 18 15 - 41 U/L   ALT 16 0 - 44 U/L   Alkaline Phosphatase 72 38 - 126 U/L   Total Bilirubin 1.2 0.3 - 1.2 mg/dL   GFR calc non Af Amer >60 >60 mL/min   GFR calc Af Amer >60 >60  mL/min   Anion gap 10 5 - 15  CBC  Result Value Ref Range   WBC 13.8 (H) 3.6 - 11.0 K/uL   RBC 4.57 3.80 - 5.20 MIL/uL   Hemoglobin 14.6 12.0 - 16.0 g/dL   HCT 56.9 79.4 - 80.1 %   MCV 92.1 80.0 - 100.0 fL   MCH 32.0 26.0 - 34.0 pg   MCHC 34.7 32.0 - 36.0 g/dL   RDW 65.5 37.4 - 82.7 %   Platelets 278 150 - 440 K/uL  hCG, quantitative, pregnancy  Result Value Ref Range   hCG, Beta Chain, Quant, S <1 <5 mIU/mL  Beta-hydroxybutyric acid  Result Value Ref Range   Beta-Hydroxybutyric Acid 0.29 (H) 0.05 - 0.27 mmol/L  Ethanol  Result Value Ref Range   Alcohol, Ethyl (B) <10 <10 mg/dL  Pregnancy, urine POC  Result Value Ref Range   Preg Test, Ur NEGATIVE NEGATIVE

## 2018-07-11 ENCOUNTER — Other Ambulatory Visit: Payer: Self-pay

## 2018-07-11 ENCOUNTER — Emergency Department
Admission: EM | Admit: 2018-07-11 | Discharge: 2018-07-11 | Disposition: A | Discharge status: Home / Self Care | Payer: Self-pay | Attending: Emergency Medicine | Admitting: Emergency Medicine

## 2018-07-11 ENCOUNTER — Emergency Department: Payer: Self-pay

## 2018-07-11 DIAGNOSIS — R109 Unspecified abdominal pain: Secondary | ICD-10-CM | POA: Insufficient documentation

## 2018-07-11 DIAGNOSIS — R197 Diarrhea, unspecified: Secondary | ICD-10-CM | POA: Insufficient documentation

## 2018-07-11 DIAGNOSIS — Z79899 Other long term (current) drug therapy: Secondary | ICD-10-CM | POA: Insufficient documentation

## 2018-07-11 DIAGNOSIS — F17210 Nicotine dependence, cigarettes, uncomplicated: Secondary | ICD-10-CM | POA: Insufficient documentation

## 2018-07-11 DIAGNOSIS — R112 Nausea with vomiting, unspecified: Secondary | ICD-10-CM | POA: Insufficient documentation

## 2018-07-11 LAB — URINALYSIS, COMPLETE (UACMP) WITH MICROSCOPIC
BILIRUBIN URINE: NEGATIVE
Glucose, UA: NEGATIVE mg/dL
Hgb urine dipstick: NEGATIVE
KETONES UR: NEGATIVE mg/dL
Leukocytes, UA: NEGATIVE
NITRITE: NEGATIVE
SPECIFIC GRAVITY, URINE: 1.015 (ref 1.005–1.030)
pH: >9 — ABNORMAL HIGH (ref 5.0–8.0)

## 2018-07-11 LAB — COMPREHENSIVE METABOLIC PANEL
ALT: 16 U/L (ref 0–44)
AST: 21 U/L (ref 15–41)
Albumin: 3.9 g/dL (ref 3.5–5.0)
Alkaline Phosphatase: 62 U/L (ref 38–126)
Anion gap: 10 (ref 5–15)
BUN: 9 mg/dL (ref 6–20)
CO2: 19 mmol/L — ABNORMAL LOW (ref 22–32)
CREATININE: 0.6 mg/dL (ref 0.44–1.00)
Calcium: 9.3 mg/dL (ref 8.9–10.3)
Chloride: 107 mmol/L (ref 98–111)
GFR calc non Af Amer: >60 mL/min (ref >60)
Glucose, Bld: 111 mg/dL — ABNORMAL HIGH (ref 70–99)
Potassium: 3.9 mmol/L (ref 3.5–5.1)
SODIUM: 136 mmol/L (ref 135–145)
Total Bilirubin: 0.8 mg/dL (ref 0.3–1.2)
Total Protein: 7.6 g/dL (ref 6.5–8.1)

## 2018-07-11 LAB — CHLAMYDIA/NGC RT PCR (ARMC ONLY)
Chlamydia Tr: NOT DETECTED
N gonorrhoeae: NOT DETECTED

## 2018-07-11 LAB — WET PREP, GENITAL
Clue Cells Wet Prep HPF POC: NONE SEEN
Sperm: NONE SEEN
Trich, Wet Prep: NONE SEEN
YEAST WET PREP: NONE SEEN

## 2018-07-11 LAB — CBC
HEMATOCRIT: 44.8 % (ref 36.0–46.0)
HEMOGLOBIN: 14.5 g/dL (ref 12.0–15.0)
MCH: 30.8 pg (ref 26.0–34.0)
MCHC: 32.4 g/dL (ref 30.0–36.0)
MCV: 95.1 fL (ref 80.0–100.0)
Platelets: 249 10*3/uL (ref 150–400)
RBC: 4.71 MIL/uL (ref 3.87–5.11)
RDW: 12.2 % (ref 11.5–15.5)
WBC: 14.5 10*3/uL — AB (ref 4.0–10.5)
nRBC: 0 % (ref 0.0–0.2)

## 2018-07-11 LAB — LIPASE, BLOOD: Lipase: 16 U/L (ref 11–51)

## 2018-07-11 LAB — PREGNANCY, URINE: PREG TEST UR: NEGATIVE

## 2018-07-11 MED ORDER — IOPAMIDOL (ISOVUE-300) INJECTION 61%
30.0000 mL | Freq: Once | INTRAVENOUS | Status: AC | PRN
  Administered 2018-07-11: 30 mL via ORAL

## 2018-07-11 MED ORDER — ONDANSETRON HCL 4 MG/2ML IJ SOLN
INTRAMUSCULAR | Status: AC
  Filled 2018-07-11: qty 2

## 2018-07-11 MED ORDER — ONDANSETRON HCL 4 MG/2ML IJ SOLN
4.0000 mg | Freq: Once | INTRAMUSCULAR | Status: AC
  Administered 2018-07-11: 4 mg via INTRAVENOUS

## 2018-07-11 MED ORDER — ONDANSETRON HCL 4 MG PO TABS: 4.0000 mg | ORAL_TABLET | Freq: Three times a day (TID) | ORAL | 0 refills | Status: AC | PRN

## 2018-07-11 MED ORDER — SODIUM CHLORIDE 0.9 % IV BOLUS
1000.0000 mL | Freq: Once | INTRAVENOUS | Status: AC
  Administered 2018-07-11: 1000 mL via INTRAVENOUS

## 2018-07-11 MED ORDER — KETOROLAC TROMETHAMINE 30 MG/ML IJ SOLN
30.0000 mg | Freq: Once | INTRAMUSCULAR | Status: AC
  Administered 2018-07-11: 30 mg via INTRAVENOUS
  Filled 2018-07-11: qty 1

## 2018-07-11 MED ORDER — IOPAMIDOL (ISOVUE-300) INJECTION 61%
100.0000 mL | Freq: Once | INTRAVENOUS | Status: AC | PRN
  Administered 2018-07-11: 100 mL via INTRAVENOUS

## 2018-07-11 MED ORDER — ONDANSETRON HCL 4 MG/2ML IJ SOLN
4.0000 mg | Freq: Once | INTRAMUSCULAR | Status: AC
  Administered 2018-07-11: 4 mg via INTRAVENOUS
  Filled 2018-07-11: qty 2

## 2018-07-11 NOTE — Discharge Instructions (Addendum)
Return to the emerge department immediately for any worsening condition including vomiting blood, black or bloody stools, no worsening abdominal pain, dizziness or passing out, fever, or any other symptoms concerning to you.

## 2018-07-11 NOTE — ED Provider Notes (Signed)
[facility] Emergency Department Provider Note ____________________________________________   I have reviewed the triage vital signs and the triage nursing note.  HISTORY  Chief Complaint Abdominal Pain   Historian Patient  HPI Selena Nelson is a 27 y.o. female presents with vomiting and diarrhea since yesterday.  Numerous episodes of nonbloody nonbilious emesis.  Numerous episodes of watery diarrhea.  No bloody diarrhea.  Moderate periumbilical abdominal cramping type pain.  History of cholecystectomy.  Denies vaginal pelvic symptoms.  Positive for decreased urination and some hesitancy without any hematuria.  Pain in the mid abdomen is 5 out of 10.  She is declining any pain medication at this point time.  No known sick contacts or other folks who are sick as well.     Past Medical History:  Diagnosis Date  . GERD (gastroesophageal reflux disease)   . History of kidney stones    CURRENTLY  . Opiate addiction (HCC)    pt taking Suboxone daily    Patient Active Problem List   Diagnosis Date Noted  . Gallbladder stone without cholecystitis or obstruction 12/15/2017    Past Surgical History:  Procedure Laterality Date  . CHOLECYSTECTOMY N/A 12/21/2017   Procedure: LAPAROSCOPIC CHOLECYSTECTOMY WITH INTRAOPERATIVE CHOLANGIOGRAM;  Surgeon: Earline Mayotte, MD;  Location: [facility] ORS;  Service: General;  Laterality: N/A;  . MOUTH SURGERY  11/2017   JUST HAD SOME UPPER TEETH PULLED AND NOW HAS A PARTIAL PLATE  . TONSILLECTOMY AND ADENOIDECTOMY  1996  . WISDOM TOOTH EXTRACTION      Prior to Admission medications   Medication Sig Start Date End Date Taking? Authorizing Provider  Buprenorphine HCl-Naloxone HCl (SUBOXONE SL) Place 1 tablet under the tongue every morning.     [provider]  DULoxetine (CYMBALTA) 30 MG capsule Take 90 mg by mouth every morning.     [provider]  etonogestrel (NEXPLANON) 68 MG IMPL implant 1 each by  Subdermal route once.    [provider]  HYDROcodone-acetaminophen (NORCO/VICODIN) 5-325 MG tablet Take 1 tablet by mouth every 4 (four) hours as needed for moderate pain. 12/21/17 12/21/18  Earline Mayotte, MD  omeprazole (PRILOSEC) 40 MG capsule Take 1 capsule (40 mg total) by mouth daily. 11/22/17 11/22/18  Jene Every, MD  ondansetron (ZOFRAN) 4 MG tablet Take 1 tablet (4 mg total) by mouth every 8 (eight) hours as needed for nausea or vomiting. 07/11/18   Governor Rooks, MD    Allergies  Allergen Reactions  . Amoxicillin Other (See Comments)    Has patient had a PCN reaction causing immediate rash, facial/tongue/throat swelling, SOB or lightheadedness with hypotension: Yes Has patient had a PCN reaction causing severe rash involving mucus membranes or skin necrosis: Yes Has patient had a PCN reaction that required hospitalization: Yes Has patient had a PCN reaction occurring within the last 10 years: Yes If all of the above answers are "NO", then may proceed with Cephalosporin use.   Marland Kitchen Penicillins Other (See Comments)    Has patient had a PCN reaction causing immediate rash, facial/tongue/throat swelling, SOB or lightheadedness with hypotension: Yes Has patient had a PCN reaction causing severe rash involving mucus membranes or skin necrosis: Yes Has patient had a PCN reaction that required hospitalization: Yes Has patient had a PCN reaction occurring within the last 10 years: Yes If all of the above answers are "NO", then may proceed with Cephalosporin use.     Family History  Problem Relation Age of Onset  . Breast  cancer Maternal Grandmother     Social History Social History   Tobacco Use  . Smoking status: Current Every Day Smoker    Packs/day: 0.50    Years: 2.00    Pack years: 1.00    Types: Cigarettes  . Smokeless tobacco: Never Used  Substance Use Topics  . Alcohol use: No    Frequency: Never  . Drug use: Not Currently    Types: Other-see comments     Comment:  history of opioid abuse    Review of Systems  Constitutional: Negative for fever. Eyes: Negative for visual changes. ENT: Negative for sore throat. Cardiovascular: Negative for chest pain. Respiratory: Negative for shortness of breath. Gastrointestinal: Positive for vomiting and diarrhea as per HPI as well as mid abdominal cramping. Genitourinary: Positive for dysuria. Musculoskeletal: Negative for back pain. Skin: Negative for rash. Neurological: Negative for headache.  ____________________________________________   PHYSICAL EXAM:  VITAL SIGNS: ED Triage Vitals [07/11/18 1213]  Enc Vitals Group     BP 138/89     Pulse Rate 86     Resp 19     Temp 98 F (36.7 C)     Temp Source Oral     SpO2 100 %     Weight 155 lb (70.3 kg)     Height 5\' 4"  (1.626 m)     Head Circumference      Peak Flow      Pain Score 5     Pain Loc      Pain Edu?      Excl. in GC?      Constitutional: Alert and oriented.  HEENT      Head: Normocephalic and atraumatic.      Eyes: Conjunctivae are normal. Pupils equal and round.       Ears:         Nose: No congestion/rhinnorhea.      Mouth/Throat: Mucous membranes are moderately dry.      Neck: No stridor. Cardiovascular/Chest: Normal rate, regular rhythm.  No murmurs, rubs, or gallops. Respiratory: Normal respiratory effort without tachypnea nor retractions. Breath sounds are clear and equal bilaterally. No wheezes/rales/rhonchi. Gastrointestinal: Soft. No distention, no guarding, no rebound.  Tenderness in the mid abdomen. Genitourinary/rectal: Small amount of vaginal discharge.  No vaginal bleeding.  No cervicitis.  No adnexal mass or tenderness. Musculoskeletal: Nontender with normal range of motion in all extremities. No joint effusions.  No lower extremity tenderness.  No edema. Neurologic:  Normal speech and language. No gross or focal neurologic deficits are appreciated. Skin:  Skin is warm, dry and intact. No rash  noted. Psychiatric: Mood and affect are normal. Speech and behavior are normal. Patient exhibits appropriate insight and judgment.   ____________________________________________  LABS (pertinent positives/negatives) I, Governor Rooks, MD the attending physician have reviewed the labs noted below.  Labs Reviewed  CBC - Abnormal; Notable for the following components:      Result Value   WBC 14.5 (*)    All other components within normal limits  COMPREHENSIVE METABOLIC PANEL - Abnormal; Notable for the following components:   CO2 19 (*)    Glucose, Bld 111 (*)    All other components within normal limits  CHLAMYDIA/NGC RT PCR ([facility] ONLY)  WET PREP, GENITAL  LIPASE, BLOOD  URINALYSIS, COMPLETE (UACMP) WITH MICROSCOPIC  PREGNANCY, URINE  POC URINE PREG, ED    ____________________________________________    EKG I, Governor Rooks, MD, the attending physician have personally viewed and interpreted all ECGs.  None ____________________________________________  RADIOLOGY   None __________________________________________  PROCEDURES  Procedure(s) performed: None  Procedures  Critical Care performed: None   ____________________________________________  ED COURSE / ASSESSMENT AND PLAN  Pertinent labs & imaging results that were available during my care of the patient were reviewed by me and considered in my medical decision making (see chart for details).    Patient looks moderately dehydrated clinically, started IV fluids.  Main complaint is nausea with vomiting and diarrhea.  Am giving her a Zofran.  Suspect viral GI illness, common in the community right now.  On reexamination around 3 PM after liter of normal saline, patient still having significant abdominal pain and we discussed the risk and benefit of CT scan for evaluation of intra-abdominal emergency and chose to proceed.  Pelvic exam was completed, no clinical suspicion for cervicitis.  Second liter  of normal saline was initiated as well as Toradol for pain.  Patient care transferred to Dr. Cyril Loosen at shift change 3pm.  CT abd pel pending.  Dispo imaging and reevaluation.    CONSULTATIONS:   None   Patient / Family / Caregiver informed of clinical course, medical decision-making process, and agree with plan.   I discussed return precautions, follow-up instructions, and discharge instructions with patient and/or family.  Discharge Instructions : Return to the emerge department immediately for any worsening condition including vomiting blood, black or bloody stools, no worsening abdominal pain, dizziness or passing out, fever, or any other symptoms concerning to you.    ___________________________________________   FINAL CLINICAL IMPRESSION(S) / ED DIAGNOSES   Final diagnoses:  Nausea vomiting and diarrhea      ___________________________________________         Note: This dictation was prepared with Dragon dictation. Any transcriptional errors that result from this process are unintentional    Governor Rooks, MD 07/11/18 1459

## 2018-07-11 NOTE — ED Notes (Signed)
Pregnancy test NEGATIVE

## 2018-07-11 NOTE — ED Triage Notes (Addendum)
Pt comes via POV from home with abdominal pain. Pt states N/V/D for 2 days. Pt states pain behind umbilicus. Pt is Alert.

## 2018-07-11 NOTE — ED Notes (Signed)
IV attempted and unable to collect blood sample at this time.

## 2018-07-11 NOTE — ED Notes (Signed)
Pt refused to attempt to obtain urine sample at this time. Verbalized to patient the importance of obtaining urine sample.

## 2018-07-11 NOTE — ED Notes (Signed)
Pt states unable to provide urine specimen at this time. Pt informed of needing specimen when able

## 2018-10-30 IMAGING — CT CT ABD-PELV W/ CM
2 of 4 series · 16 of 46 positions shown, 18 images · IV contrast (APPLIED)
Comparison: Body CT 10/02/2016

CLINICAL DATA: Abdominal pain for 2 days, nausea vomiting and
diarrhea. Postcholecystectomy.

EXAM:
CT ABDOMEN AND PELVIS WITH CONTRAST
TECHNIQUE: Multidetector CT imaging of the abdomen and pelvis was performed
using the standard protocol following bolus administration of
intravenous contrast.
CONTRAST:  100mL TE6DLQ-SYY IOPAMIDOL (TE6DLQ-SYY) INJECTION 61%

[Series 2: routine abd/pel with · axial · 0.68mm/px · z∈[-1096,-661]mm · 13 of 95 slices shown, 15 images]
[im 4/95  soft-tissue]
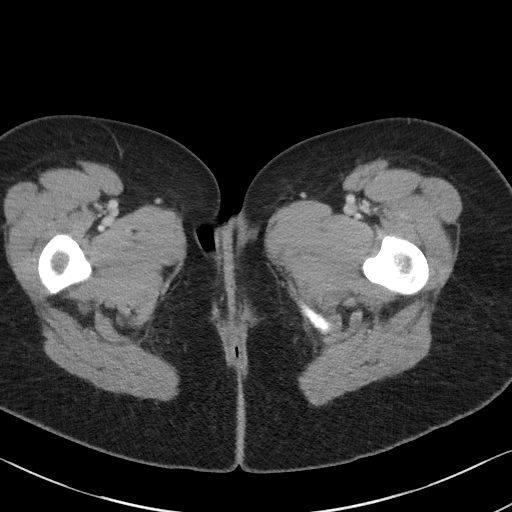
[im 4/95  bone]
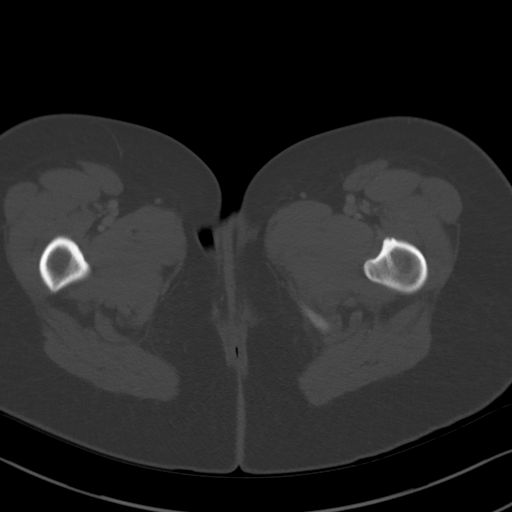
[im 12/95  soft-tissue]
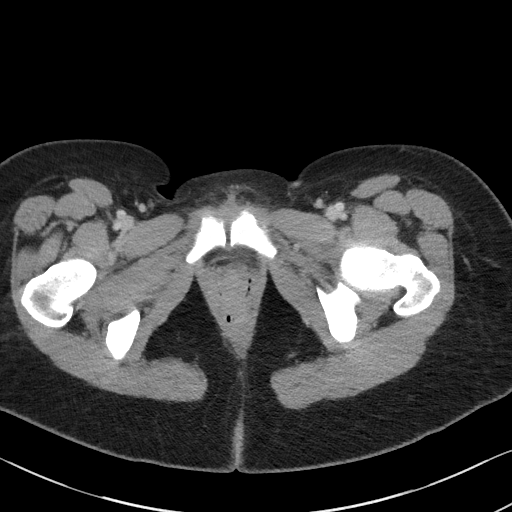
[im 19/95  soft-tissue]
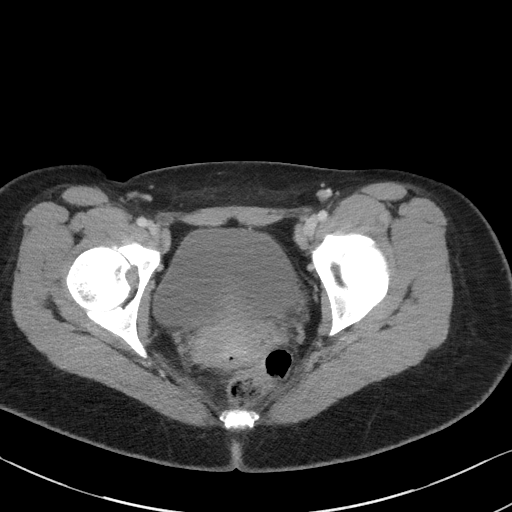
[im 27/95  soft-tissue]
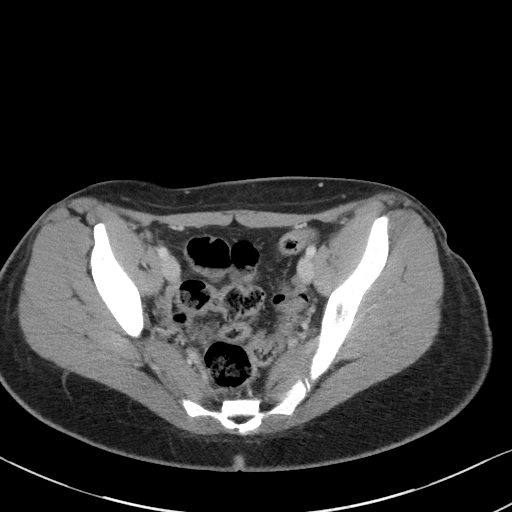
[im 34/95  soft-tissue]
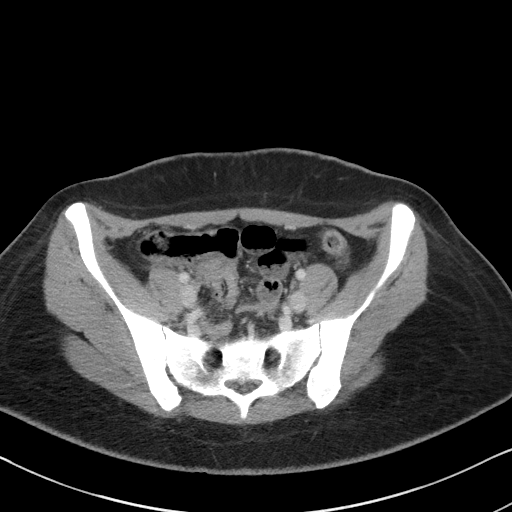
[im 42/95  soft-tissue]
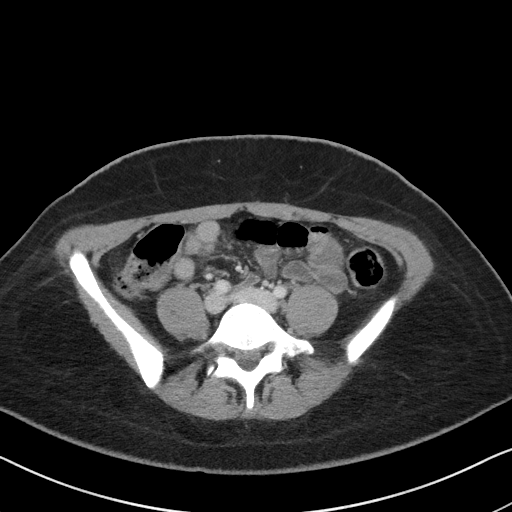
[im 49/95  soft-tissue]
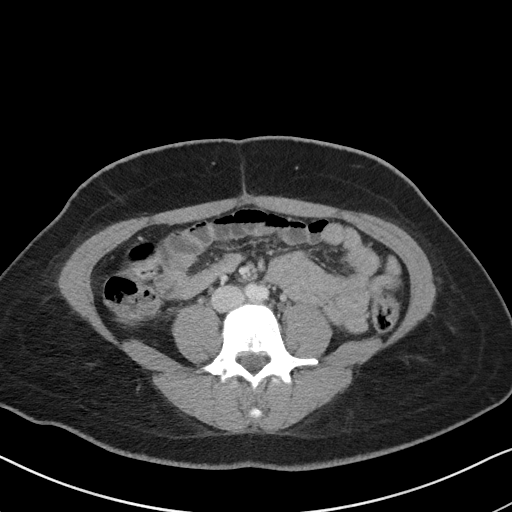
[im 53/95  soft-tissue]
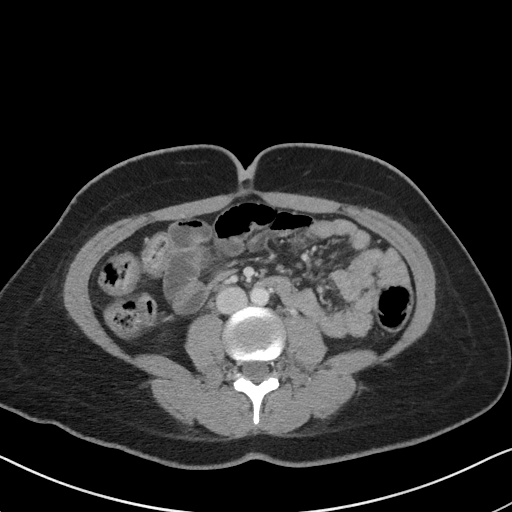
[im 61/95  soft-tissue]
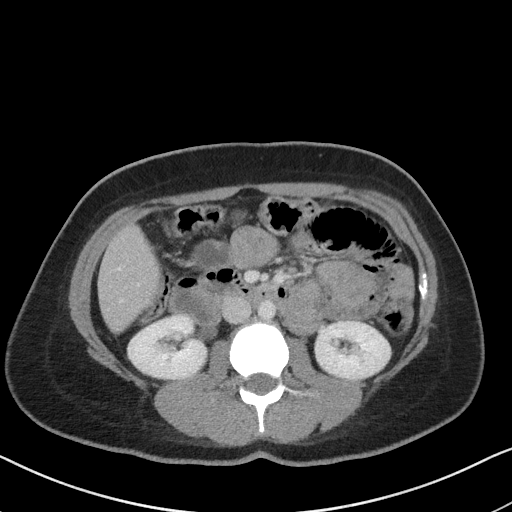
[im 61/95  bone]
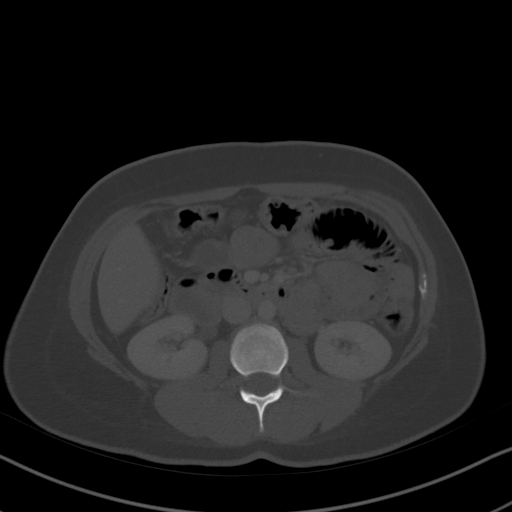
[im 68/95  soft-tissue]
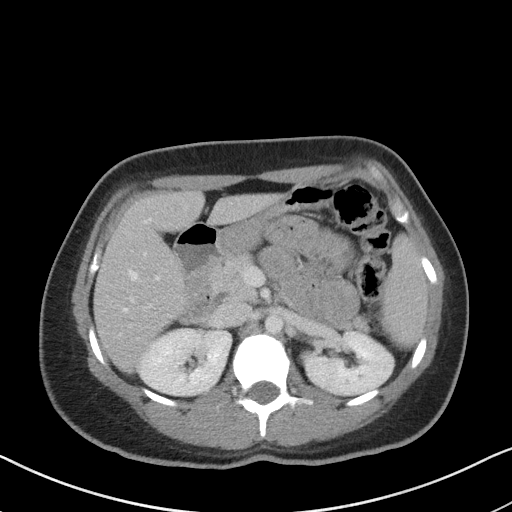
[im 76/95  soft-tissue]
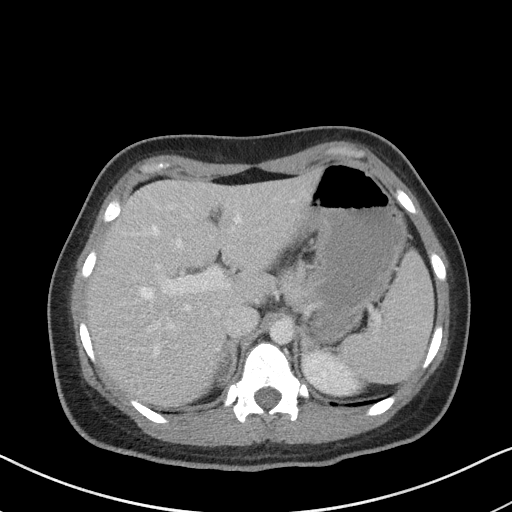
[im 83/95  soft-tissue]
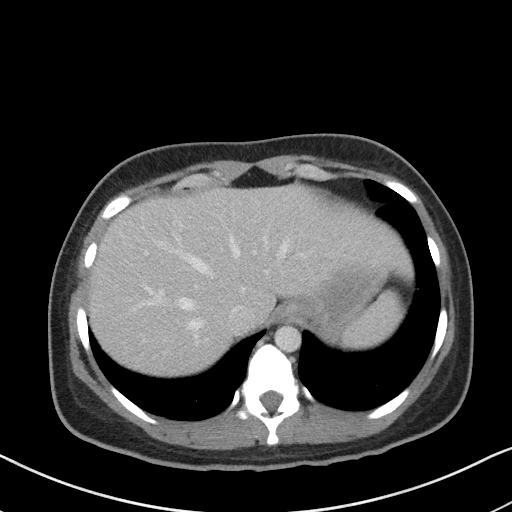
[im 91/95  soft-tissue]
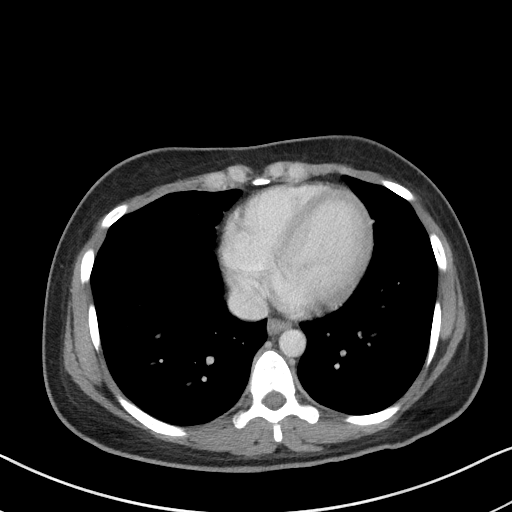

[Series 5: coronal st · coronal · 0.70mm/px · 3 of 73 slices shown]
[im 25/73  soft-tissue]
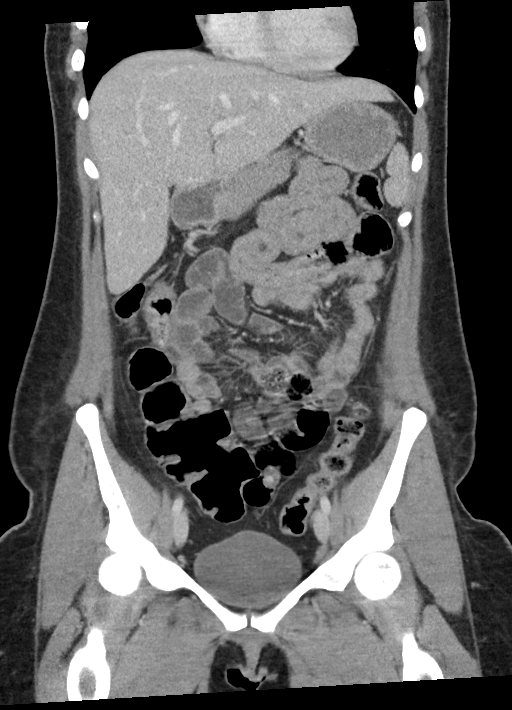
[im 33/73  soft-tissue]
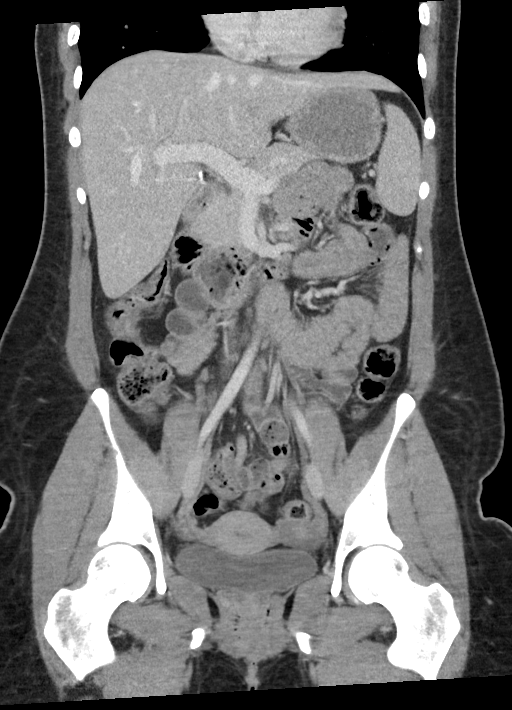
[im 41/73  soft-tissue]
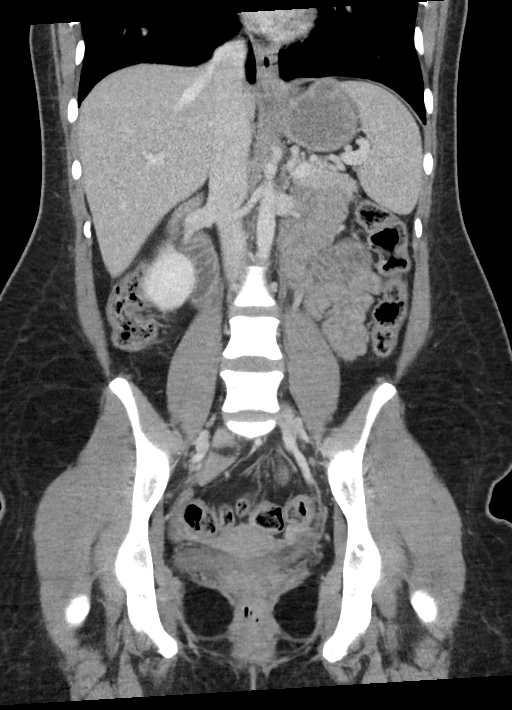

[16 of 46 positions shown; findings below may reference images not displayed]

FINDINGS: Lower chest: No acute abnormality.

Hepatobiliary: No focal liver abnormality is seen. Status post
cholecystectomy. Mild prominence of the extrahepatic common bile
duct, within range for post cholecystectomy.

Pancreas: Unremarkable. No pancreatic ductal dilatation or
surrounding inflammatory changes.

Spleen: Normal in size without focal abnormality.

Adrenals/Urinary Tract: Adrenal glands are unremarkable. Kidneys are
normal, without renal calculi, focal lesion, or hydronephrosis.
Bladder is unremarkable.

Stomach/Bowel: Stomach is within normal limits. Appendix appears
normal. No evidence of bowel wall thickening, distention, or
inflammatory changes.

Vascular/Lymphatic: No significant vascular findings are present. No
enlarged abdominal or pelvic lymph nodes.

Reproductive: Uterus and bilateral adnexa are unremarkable.

Other: No abdominal wall hernia or abnormality. No abdominopelvic
ascites.

Musculoskeletal: No acute or significant osseous findings.
IMPRESSION: No acute abnormalities within the abdomen or pelvis.

## 2022-09-22 NOTE — L&D Delivery Note (Signed)
[facility]    Delivery Summary Note      Name: Joanna Hawkins   MRN: [id number]  DOB:  1991/06/18  Admitted: 02/25/2023  4:02 AM       Pre-delivery diagnosis:    32 y.o. G3P1011 at [redacted]w[redacted]d gestation.   Term labor   Precipitous labor   Substance use disorder in pregnancy    Post-delivery diagnosis:    32 year old G3P1011 at 90 weeks 3 days gestation   Term labor   Precipitous labor   Substance use disorder in pregnancy   Spontaneous vaginal delivery    Findings:           And is a 32 year old G3 P1011 who presented at 39 weeks 3 days gestation with spontaneous labor.  She was noted to be 9 cm bulging membranes at the time of presentation.  She progressed to complete rapidly and artificial rupture of membranes was completed.  She had a terminal bradycardia during pushing and delivered the baby expeditiously with an intact perineum.  Baby was placed directly on maternal abdomen and delayed cord clamping was completed for 30 seconds then doubly clamped and transected.  A cord segment was saved.  Cord blood was collected and the placenta was delivered without difficulty.    "Delivery Information"      IO Blood Loss  02/25/23 0200 - 02/25/23 0621      [facility] Encounter 100 mL    Total  100 mL               [name], Girl [[id number]]      Labor Events    Preterm labor?: No  Antenatal steroids: None  Rupture date/time: 02/25/2023 0419  Rupture type: Artificial  Fluid color: Clear  Fluid odor: none  Labor type: Spontaneous Onset of Labor  Augmentation: None  Was labor allowed to proceed with plans for an attempted vaginal birth?: Yes  Labor complications: Fetal Intolerance   Additional complications:  Labor Complications   Precipitate labor             Labor Events    Preterm labor?: No  Antenatal steroids: None  Rupture date/time: 02/25/2023 0419  Rupture type: Artificial  Fluid color: Clear  Augmentation: None  Labor complications: Fetal Intolerance   Additional complications:  Labor Complications    Precipitate labor             Labor Event Times      Labor onset Date/Time: 02/25/2023 0200   Dilation complete Date/Time: 02/25/2023 0410       Delivery Anesthesia    Method: None       Assisted Delivery Details    Forceps attempted?: No  Vacuum extractor attempted?: No       Shoulder Dystocia Maneuvers    Shoulder dystocia present?: No       Lacerations      Episiotomy: None          Delivery Information    Birth date/time: 02/25/2023 0421  Sex: Female  Delivery type: Vaginal, Spontaneous  Complications: Fetal Intolerance       Newborn Presentation    Presentation: Vertex  Position: Occiput Anterior       Cord Information    Vessels: 3 Vessels  Complications: None       Resuscitation    Method: Suctioning, Oxygen  Neonatal resuscitation comments: see nurses note       Newborn  Apgars    Living status: Living  Skin color:    Heart rate:    Reflex Irrit:    Muscle tone:    Resp. effort:    Total:     1 Min:    1    2    2    2    1    8     5  Min:    1    2    2    2    1    8     10  Min:     15 Min:     20 Min:       Apgars assigned by: Candie Echevaria RN       Skin to Skin    Reason skin to skin not initiated: Newborn Acuity       Newborn Measurements    Weight: 2880 g       Placenta    Date/time: 02/25/2023 0425  Removal: Spontaneous  Appearance: Intact  Disposition: Discarded       Other Delivery Procedures    Procedures: None       Delivery Providers      Delivering Clinician: Budd Palmer, DO   Provider Role   Herma Ard, RN Registered Nurse   Candie Echevaria, GN Baby Nurse   Ramonita Lab, PAT CARE [facility] Burna Cash, RN Charge Nurse                   Labor Event Times    Labor onset date/time: 02/25/2023 0200  Dilation complete date/time: 02/25/2023 0410       Labor Length      1st stage: 2h 68m   2nd stage: 0h 68m   3rd stage: Riki Altes 54m                 Raynelle Highland Tykerria Mccubbins, DO 02/26/2023, 10:40

## 2022-12-13 ENCOUNTER — Ambulatory Visit (HOSPITAL_COMMUNITY): Payer: Medicaid Other

## 2022-12-13 ENCOUNTER — Ambulatory Visit
Admission: RE | Admit: 2022-12-13 | Discharge: 2022-12-13 | Disposition: A | Discharge status: Home / Self Care | Payer: Medicaid Other | Attending: Obstetrics & Gynecology | Admitting: Obstetrics & Gynecology

## 2022-12-13 ENCOUNTER — Other Ambulatory Visit: Payer: Self-pay

## 2022-12-13 ENCOUNTER — Encounter (HOSPITAL_COMMUNITY): Payer: Self-pay | Admitting: Obstetrics & Gynecology

## 2022-12-13 DIAGNOSIS — Z3482 Encounter for supervision of other normal pregnancy, second trimester: Secondary | ICD-10-CM | POA: Insufficient documentation

## 2022-12-13 DIAGNOSIS — Z2913 Encounter for prophylactic Rho(D) immune globulin: Secondary | ICD-10-CM | POA: Insufficient documentation

## 2022-12-13 LAB — RHIGG FOR INJECTION: BABY'S PATIENT NUMBER: 28

## 2022-12-13 LAB — ABO/RH AND ANTIBODY SCREEN
ABO/RH(D): O NEG
ANTIBODY SCREEN: NEGATIVE

## 2022-12-13 MED ORDER — RHO(D) IMMUNE GLOBULIN 1,500 UNIT (300 MCG) INTRAMUSCULAR SYRINGE
300.0000 ug | INJECTION | Freq: Once | INTRAMUSCULAR | Status: AC
Start: 2022-12-13 — End: 2022-12-13
  Administered 2022-12-13: 300 ug via INTRAMUSCULAR
  Filled 2022-12-13: qty 300

## 2022-12-14 LAB — RHIG FOR TRANSFUSION: UNIT DIVISION: 0

## 2023-02-25 ENCOUNTER — Ambulatory Visit (HOSPITAL_COMMUNITY)
Admission: EM | Admit: 2023-02-25 | Payer: Medicaid Other | Source: Home / Self Care | Admitting: Obstetrics & Gynecology

## 2023-02-25 ENCOUNTER — Inpatient Hospital Stay
Admission: EM | Admit: 2023-02-25 | Discharge: 2023-02-27 | DRG: 806 | Disposition: A | Discharge status: Home / Self Care | Payer: Medicaid Other | Source: Ambulatory Visit | Attending: Obstetrics & Gynecology | Admitting: Obstetrics & Gynecology

## 2023-02-25 ENCOUNTER — Encounter (HOSPITAL_COMMUNITY): Payer: Self-pay | Admitting: Obstetrics & Gynecology

## 2023-02-25 DIAGNOSIS — O99323 Drug use complicating pregnancy, third trimester: Secondary | ICD-10-CM

## 2023-02-25 DIAGNOSIS — F1910 Other psychoactive substance abuse, uncomplicated: Secondary | ICD-10-CM

## 2023-02-25 DIAGNOSIS — O99334 Smoking (tobacco) complicating childbirth: Secondary | ICD-10-CM | POA: Diagnosis present

## 2023-02-25 DIAGNOSIS — Z3A39 39 weeks gestation of pregnancy: Secondary | ICD-10-CM

## 2023-02-25 DIAGNOSIS — Z6791 Unspecified blood type, Rh negative: Secondary | ICD-10-CM

## 2023-02-25 DIAGNOSIS — F1110 Opioid abuse, uncomplicated: Secondary | ICD-10-CM | POA: Diagnosis present

## 2023-02-25 DIAGNOSIS — O26893 Other specified pregnancy related conditions, third trimester: Secondary | ICD-10-CM

## 2023-02-25 DIAGNOSIS — Z23 Encounter for immunization: Secondary | ICD-10-CM

## 2023-02-25 DIAGNOSIS — F17210 Nicotine dependence, cigarettes, uncomplicated: Secondary | ICD-10-CM | POA: Diagnosis present

## 2023-02-25 DIAGNOSIS — Z87892 Personal history of anaphylaxis: Secondary | ICD-10-CM

## 2023-02-25 DIAGNOSIS — Z880 Allergy status to penicillin: Secondary | ICD-10-CM

## 2023-02-25 DIAGNOSIS — O99324 Drug use complicating childbirth: Secondary | ICD-10-CM | POA: Diagnosis present

## 2023-02-25 DIAGNOSIS — Z3490 Encounter for supervision of normal pregnancy, unspecified, unspecified trimester: Principal | ICD-10-CM | POA: Diagnosis present

## 2023-02-25 LAB — RHIGG FOR INJECTION

## 2023-02-25 LAB — CBC
HCT: 34.3 % (ref 31.2–41.9)
HGB: 11.9 g/dL (ref 10.9–14.3)
MCH: 31.7 pg (ref 24.7–32.8)
MCHC: 34.6 g/dL (ref 32.3–35.6)
MCV: 91.5 fL (ref 75.5–95.3)
MPV: 9.1 fL (ref 7.9–10.8)
PLATELETS: 260 10*3/uL (ref 140–440)
RBC: 3.74 10*6/uL (ref 3.63–4.92)
RDW: 14.3 % (ref 12.3–17.7)
WBC: 15.7 10*3/uL — ABNORMAL HIGH (ref 3.8–11.8)

## 2023-02-25 LAB — DRUG SCREEN, NO CONFIRMATION, URINE
AMPHET QL: NEGATIVE
BARB QL: NEGATIVE
BENZO QL: NEGATIVE
BUP QL: POSITIVE — AB
CANNAQL: NEGATIVE
COCQL: NEGATIVE
FENTANYL, RANDOM URINE: NEGATIVE
METHQL: NEGATIVE
OPIATE: NEGATIVE
OXYCODONE URINE: NEGATIVE
PCP QL: NEGATIVE

## 2023-02-25 LAB — LIGHT GREEN TOP TUBE

## 2023-02-25 LAB — LAVENDER TOP TUBE

## 2023-02-25 LAB — TYPE AND SCREEN
ABO/RH(D): O NEG
ANTIBODY SCREEN: NEGATIVE

## 2023-02-25 LAB — GOLD TOP TUBE

## 2023-02-25 MED ORDER — BENZOCAINE 20 %-MENTHOL 0.5 % TOPICAL AEROSOL
INHALATION_SPRAY | CUTANEOUS | Status: AC
Start: 2023-02-25 — End: 2023-02-26
  Filled 2023-02-25: qty 85

## 2023-02-25 MED ORDER — OXYCODONE-ACETAMINOPHEN 5 MG-325 MG TABLET
1.0000 | ORAL_TABLET | Freq: Four times a day (QID) | ORAL | Status: DC | PRN
Start: 2023-02-25 — End: 2023-02-27

## 2023-02-25 MED ORDER — OXYTOCIN 10 UNIT/ML INJECTION SOLUTION
10.0000 [IU] | Freq: Once | INTRAMUSCULAR | Status: AC
Start: 2023-02-25 — End: 2023-02-25
  Administered 2023-02-25: 10 [IU] via INTRAMUSCULAR
  Filled 2023-02-25: qty 1

## 2023-02-25 MED ORDER — METHYLERGONOVINE 0.2 MG/ML (1 ML) INJECTION SOLUTION
0.2000 mg | Freq: Once | INTRAMUSCULAR | Status: AC | PRN
Start: 2023-02-25 — End: 2023-02-25
  Administered 2023-02-25: 0.2 mg via INTRAMUSCULAR
  Filled 2023-02-25: qty 1

## 2023-02-25 MED ORDER — OXYTOCIN 10 UNIT/ML INJECTION SOLUTION
10.0000 [IU] | INTRAMUSCULAR | Status: AC
Start: 2023-02-25 — End: 2023-02-25
  Administered 2023-02-25: 10 [IU] via INTRAMUSCULAR

## 2023-02-25 MED ORDER — ACETAMINOPHEN 325 MG TABLET
650.0000 mg | ORAL_TABLET | Freq: Four times a day (QID) | ORAL | Status: DC | PRN
Start: 2023-02-25 — End: 2023-02-27

## 2023-02-25 MED ORDER — LIDOCAINE HCL 10 MG/ML (1 %) INJECTION SOLUTION
INTRAMUSCULAR | Status: AC
Start: 2023-02-25 — End: 2023-02-26
  Filled 2023-02-25: qty 20

## 2023-02-25 MED ORDER — METHYLERGONOVINE 0.2 MG/ML (1 ML) INJECTION SOLUTION
0.2000 mg | Freq: Once | INTRAMUSCULAR | Status: DC | PRN
Start: 2023-02-25 — End: 2023-02-27

## 2023-02-25 MED ORDER — ZOLPIDEM 5 MG TABLET
5.0000 mg | ORAL_TABLET | Freq: Every evening | ORAL | Status: DC | PRN
Start: 2023-02-25 — End: 2023-02-27

## 2023-02-25 MED ORDER — GLYCERIN-WITCH HAZEL 12.5 %-50 % TOPICAL PADS
MEDICATED_PAD | CUTANEOUS | Status: AC
Start: 2023-02-25 — End: 2023-02-26
  Filled 2023-02-25: qty 40

## 2023-02-25 MED ORDER — IBUPROFEN 800 MG TABLET
800.0000 mg | ORAL_TABLET | Freq: Four times a day (QID) | ORAL | Status: DC | PRN
Start: 2023-02-25 — End: 2023-02-27
  Administered 2023-02-25 – 2023-02-27 (×6): 800 mg via ORAL
  Filled 2023-02-25 (×6): qty 1

## 2023-02-25 MED ORDER — HYDROXYZINE PAMOATE 50 MG CAPSULE
100.0000 mg | ORAL_CAPSULE | Freq: Every evening | ORAL | Status: DC | PRN
Start: 2023-02-25 — End: 2023-02-27

## 2023-02-25 MED ORDER — LACTATED RINGERS INTRAVENOUS SOLUTION
INTRAVENOUS | Status: DC
Start: 2023-02-25 — End: 2023-02-25

## 2023-02-25 MED ORDER — CARBOPROST TROMETHAMINE 250 MCG/ML INTRAMUSCULAR SOLUTION
250.0000 ug | Freq: Once | INTRAMUSCULAR | Status: DC | PRN
Start: 2023-02-25 — End: 2023-02-25

## 2023-02-25 MED ORDER — SODIUM CHLORIDE 0.9 % (FLUSH) INJECTION SYRINGE
3.0000 mL | INJECTION | INTRAMUSCULAR | Status: DC | PRN
Start: 2023-02-25 — End: 2023-02-25

## 2023-02-25 MED ORDER — LACTATED RINGERS IV BOLUS
500.0000 mL | INJECTION | Status: DC | PRN
Start: 2023-02-25 — End: 2023-02-25

## 2023-02-25 MED ORDER — MISOPROSTOL 500 MCG TABLET
1000.0000 ug | ORAL_TABLET | Freq: Once | ORAL | Status: DC | PRN
Start: 2023-02-25 — End: 2023-02-25

## 2023-02-25 MED ORDER — OXYTOCIN 10 UNIT/ML INJECTION SOLUTION
INTRAMUSCULAR | Status: AC
Start: 2023-02-25 — End: 2023-02-26
  Filled 2023-02-25: qty 1

## 2023-02-25 MED ORDER — LACTATED RINGERS INTRAVENOUS SOLUTION
INTRAVENOUS | Status: AC
Start: 2023-02-25 — End: 2023-02-26
  Filled 2023-02-25: qty 3

## 2023-02-25 MED ORDER — PRENATAL VIT-IRON-FOLATE TAB WRAPPER
1.0000 | ORAL_TABLET | Freq: Every day | Status: DC
Start: 2023-02-25 — End: 2023-02-27
  Administered 2023-02-25 – 2023-02-27 (×3): 1 via ORAL
  Filled 2023-02-25 (×3): qty 1

## 2023-02-25 MED ORDER — RHO(D) IMMUNE GLOBULIN 1,500 UNIT (300 MCG) INTRAMUSCULAR SYRINGE
300.0000 ug | INJECTION | Freq: Once | INTRAMUSCULAR | Status: DC
Start: 2023-02-25 — End: 2023-02-27
  Filled 2023-02-25: qty 300

## 2023-02-25 MED ORDER — BISACODYL 10 MG RECTAL SUPPOSITORY
10.0000 mg | Freq: Once | RECTAL | Status: AC | PRN
Start: 2023-02-25 — End: 2023-02-25

## 2023-02-25 MED ORDER — MEASLES,MUMPS,RUBELLA VACCINE LIVE(PF)1,000-12,500TCID50/0.5 ML SUBCUT
0.5000 mL | Freq: Once | SUBCUTANEOUS | Status: AC | PRN
Start: 2023-02-25 — End: 2023-02-27
  Administered 2023-02-27: 0.5 mL via SUBCUTANEOUS

## 2023-02-25 MED ORDER — MISOPROSTOL 500 MCG TABLET
1000.0000 ug | ORAL_TABLET | Freq: Once | ORAL | Status: DC | PRN
Start: 2023-02-25 — End: 2023-02-27

## 2023-02-25 MED ORDER — OXYTOCIN 10 UNIT/ML INJECTION SOLUTION
10.0000 [IU] | Freq: Once | INTRAMUSCULAR | Status: DC
Start: 2023-02-25 — End: 2023-02-27

## 2023-02-25 MED ORDER — SODIUM CHLORIDE 0.9 % (FLUSH) INJECTION SYRINGE
3.0000 mL | INJECTION | Freq: Three times a day (TID) | INTRAMUSCULAR | Status: DC
Start: 2023-02-25 — End: 2023-02-25

## 2023-02-25 MED ORDER — CARBOPROST TROMETHAMINE 250 MCG/ML INTRAMUSCULAR SOLUTION
250.0000 ug | Freq: Once | INTRAMUSCULAR | Status: DC | PRN
Start: 2023-02-25 — End: 2023-02-27

## 2023-02-25 MED ORDER — MODIFIED LANOLIN 100 % TOPICAL CREAM
TOPICAL_CREAM | CUTANEOUS | Status: DC | PRN
Start: 2023-02-25 — End: 2023-02-27

## 2023-02-25 MED ORDER — BENZOCAINE 20 %-MENTHOL 0.5 % TOPICAL AEROSOL
1.0000 | INHALATION_SPRAY | Freq: Four times a day (QID) | CUTANEOUS | Status: DC | PRN
Start: 2023-02-25 — End: 2023-02-27

## 2023-02-25 MED ORDER — BUPRENORPHINE 8 MG-NALOXONE 2 MG SUBLINGUAL TABLET
0.5000 | SUBLINGUAL_TABLET | Freq: Two times a day (BID) | SUBLINGUAL | Status: DC
Start: 2023-02-25 — End: 2023-02-27
  Administered 2023-02-25 – 2023-02-27 (×5): 0.5 via SUBLINGUAL
  Filled 2023-02-25 (×5): qty 1

## 2023-02-25 MED ORDER — LACTATED RINGERS INTRAVENOUS SOLUTION
INTRAVENOUS | Status: DC
Start: 2023-02-25 — End: 2023-02-27

## 2023-02-25 MED ORDER — ACETAMINOPHEN 325 MG TABLET
650.0000 mg | ORAL_TABLET | ORAL | Status: DC | PRN
Start: 2023-02-25 — End: 2023-02-27

## 2023-02-25 MED ORDER — DOCUSATE SODIUM 100 MG CAPSULE
100.0000 mg | ORAL_CAPSULE | Freq: Two times a day (BID) | ORAL | Status: DC | PRN
Start: 2023-02-25 — End: 2023-02-27
  Administered 2023-02-26 – 2023-02-27 (×2): 100 mg via ORAL
  Filled 2023-02-25 (×2): qty 1

## 2023-02-25 MED ORDER — GLYCERIN-WITCH HAZEL 12.5 %-50 % TOPICAL PADS
MEDICATED_PAD | CUTANEOUS | Status: DC | PRN
Start: 2023-02-25 — End: 2023-02-27

## 2023-02-25 NOTE — Nurses Notes (Signed)
Pt almost at 10 cm,  called Dr. Vida Rigger. Stated that he is on his way

## 2023-02-25 NOTE — H&P (Signed)
[facility]  OB-GYN   History and Physical Note      PCP:  No Pcp  Referring Physician:  Budd Palmer*    Subjective:      Joanna Hawkins, Joanna Hawkins is a 32 y.o. G50P1011 female.  Encounter Start Date:  02/25/2023  Inpatient Admission Date:  02/25/2023  Date of Service: 02/25/2023    Chief Complaint:  Pregnancy    Lab Results   Component Value Date    ABORHD PENDING 02/25/2023     HIV: NR VDRL:  NR  Hep B SAG:  Neg  GBS:  -  Rubella:  immune  GC:  Neg    HPI:  Joanna Hawkins is a 32yo G2P1011 female at 19w gestation who presented with term labor and precipitous delivery.     OB History   Gravida Para Term Preterm AB Living   3 1 1   1 1    SAB IAB Ectopic Multiple Live Births                  # Outcome Date GA Lbr Len/2nd Weight Sex Delivery Anes PTL Lv   3 Current            2 AB            1 Term              No past medical history on file.  Past Medical History was reviewed.    Past Surgical History:   Procedure Laterality Date    HX CHOLECYSTECTOMY      HX TONSILLECTOMY           Medications Prior to Admission       None          carboprost tromethamine (HEMABATE) 250 mcg/mL injection, 250 mcg, IntraMUSCULAR, Once PRN  lidocaine 10 mg/mL (1 %) injection ---Cabinet Override, , ,   LR bolus infusion 500 mL, 500 mL, Intravenous, Q4H PRN  LR premix infusion, , Intravenous, Continuous  methylergonovine (METHERGINE) 0.2 mg/mL injection, 0.2 mg, IntraMUSCULAR, Once PRN  miSOPROStol (CYTOTEC) tablet, 1,000 mcg, Rectal, Once PRN  NS flush syringe, 3 mL, Intracatheter, Q8HRS  NS flush syringe, 3 mL, Intracatheter, Q1H PRN      Allergies   Allergen Reactions    Penicillins Anaphylaxis     Social History     Tobacco Use    Smoking status: Every Day     Current packs/day: 1.00     Types: Cigarettes    Smokeless tobacco: Never   Vaping Use    Vaping status: Never Used   Substance Use Topics    Alcohol use: Never     Social History     Substance and Sexual Activity   Drug Use Not on file                     Heart Rate:  64  BP (Non-Invasive): (!) 143/58    No LMP recorded. Patient is pregnant.   Estimated Date of Delivery: 6/9/[redacted]     Weeks gestation:  [redacted]w[redacted]d    Other than ROS in the HPI, all other systems were negative.    Labs:  I have reviewed all lab results.     Objective:     Respiratory:  Clear to auscultation bilaterally.   Cardiovascular:  regular rate and rhythm  Gastrointestinal:  Soft, non-tender            Presentation:  Cephalic    Estimated  fetal weight:  3400    Pelvimetry:  average    Assessment:     Active Hospital Problems    Diagnosis    Substance abuse affecting pregnancy in third trimester, antepartum (CMS HCC)    Rh negative status during pregnancy, third trimester    [redacted] weeks gestation of pregnancy       Plan:     Admit to inpatient  IM pitocin  Precipitous delivery    Budd Palmer, DO

## 2023-02-25 NOTE — Nurses Notes (Signed)
Unable to obtain IV access. Patient was stuck a total of 6 times. Provider notified.

## 2023-02-25 NOTE — Care Plan (Signed)
Problem: Adult Inpatient Plan of Care  Goal: Plan of Care Review  Outcome: Ongoing (see interventions/notes)  Goal: Patient-Specific Goal (Individualized)  Outcome: Ongoing (see interventions/notes)  Flowsheets (Taken 02/25/2023 0439)  Individualized Care Needs: explain process  Anxieties, Fears or Concerns: healthy mom and baby  Patient-Specific Goals (Include Timeframe): healthy mom and baby  Goal: Absence of Hospital-Acquired Illness or Injury  Outcome: Ongoing (see interventions/notes)  Intervention: Prevent Skin Injury  Recent Flowsheet Documentation  Taken 02/25/2023 0430 by Herma Ard, RN  Skin Protection: adhesive use limited  Goal: Optimal Comfort and Wellbeing  Outcome: Ongoing (see interventions/notes)  Goal: Rounds/Family Conference  Outcome: Ongoing (see interventions/notes)     Problem: Postpartum (Vaginal Delivery)  Goal: Successful Maternal Role Transition  Outcome: Ongoing (see interventions/notes)  Goal: Hemostasis  Outcome: Ongoing (see interventions/notes)  Goal: Absence of Infection Signs and Symptoms  Outcome: Ongoing (see interventions/notes)  Goal: Anesthesia/Sedation Recovery  Outcome: Ongoing (see interventions/notes)  Goal: Optimal Pain Control and Function  Outcome: Ongoing (see interventions/notes)  Goal: Effective Urinary Elimination  Outcome: Ongoing (see interventions/notes)

## 2023-02-25 NOTE — Care Plan (Signed)
Problem: Adult Inpatient Plan of Care  Goal: Plan of Care Review  Outcome: Ongoing (see interventions/notes)  Goal: Patient-Specific Goal (Individualized)  Outcome: Ongoing (see interventions/notes)  Flowsheets (Taken 02/25/2023 0734)  Individualized Care Needs: Discuss visting infant in nursery  Anxieties, Fears or Concerns: Healthy baby  Patient-Specific Goals (Include Timeframe): Healthy mom and baby  Goal: Absence of Hospital-Acquired Illness or Injury  Intervention: Prevent Skin Injury  Recent Flowsheet Documentation  Taken 02/25/2023 0734 by Skip Mayer, RN  Skin Protection: adhesive use limited  Intervention: Prevent and Manage VTE (Venous Thromboembolism) Risk  Recent Flowsheet Documentation  Taken 02/25/2023 0734 by Skip Mayer, RN  VTE Prevention/Management: ambulation promoted  Goal: Optimal Comfort and Wellbeing  Outcome: Ongoing (see interventions/notes)

## 2023-02-26 LAB — HEPATITIS C ANTIBODY SCREEN WITH REFLEX TO HCV PCR
HCV ANTIBODY QUALITATIVE: REACTIVE — AB
HCV ANTIBODY QUALITATIVE: REACTIVE — AB

## 2023-02-26 LAB — SYPHILIS SCREENING ALGORITHM WITH REFLEX, SERUM
SYPHILIS TP ANTIBODIES: NONREACTIVE
SYPHILIS TP ANTIBODIES: NONREACTIVE

## 2023-02-26 LAB — HEPATITIS C VIRUS (HCV) RNA DETECTION AND QUANTIFICATION, PCR, PLASMA: HCV QUANTITATIVE PCR: NOT DETECTED

## 2023-02-26 NOTE — Progress Notes (Signed)
Post-Partum Progress Note     S: PPD#1  Patient is doing well, no pain. No fever, chills. Denies CP, SOB  Baby is breast feeding  Pain control is adequate. No excessive PP bleeding, no abnormal discharge  Pateint is tolerating PO    Ambulating. Normal BM, micturation      0:  Filed Vitals:    06/04/2009  4:35 PM 06/04/2009  9:04 PM 06/05/2009 12:45 AM 06/05/2009  1:07 AM   BP: 121/71 146/87  140/74   Pulse: 93 94  108   Temp: 36.9 C (98.4 F) 36.8 C (98.2 F) 36.8 C (98.2 F) 36.6 C (97.9 F)   Resp: 18 18  18    SpO2:           General: AOx3, NAD  CV: RRR, no audible murmur  Lungs: CTAB  ABD: UFFBU, NTTP, soft, +BS  EXT: no edema, non-tender to palpation    A/P:  PPD#1 s/p SVD doing well. Continue inpatient care.     Budd Palmer, DO, PhD, FACOG 12:22

## 2023-02-27 MED ORDER — ACETAMINOPHEN 500 MG TABLET
1000.0000 mg | ORAL_TABLET | Freq: Four times a day (QID) | ORAL | Status: AC | PRN
Start: 2023-02-27 — End: ?

## 2023-02-27 MED ORDER — DIPHTH,PERTUSSIS(ACEL),TETANUS 2.5 LF UNIT-8 MCG-5 LF/0.5ML IM SYRINGE
0.5000 mL | INJECTION | Freq: Once | INTRAMUSCULAR | Status: AC
Start: 2023-02-27 — End: 2023-02-27
  Administered 2023-02-27: 0.5 mL via INTRAMUSCULAR
  Filled 2023-02-27: qty 0.5

## 2023-02-27 MED ORDER — MEASLES,MUMPS,RUBELLA VACCINE LIVE(PF)1,000-12,500TCID50/0.5 ML SUBCUT
0.5000 mL | Freq: Once | SUBCUTANEOUS | Status: DC
Start: 2023-02-27 — End: 2023-02-27
  Administered 2023-02-27: 0 mL via SUBCUTANEOUS
  Filled 2023-02-27: qty 0.5

## 2023-02-27 MED ORDER — IBUPROFEN 600 MG TABLET
600.0000 mg | ORAL_TABLET | Freq: Four times a day (QID) | ORAL | Status: AC
Start: 2023-02-27 — End: ?

## 2023-02-27 NOTE — Care Plan (Signed)
Problem: Adult Inpatient Plan of Care  Goal: Plan of Care Review  Outcome: Ongoing (see interventions/notes)  Goal: Patient-Specific Goal (Individualized)  Outcome: Ongoing (see interventions/notes)  Flowsheets (Taken 02/27/2023 0845)  Individualized Care Needs: Routine postpartum care  Anxieties, Fears or Concerns: Healthy baby  Patient-Specific Goals (Include Timeframe): Discharge of healthy mom and healthy baby  Goal: Absence of Hospital-Acquired Illness or Injury  Outcome: Ongoing (see interventions/notes)  Intervention: Identify and Manage Fall Risk  Recent Flowsheet Documentation  Taken 02/27/2023 0845 by Eileen Stanford, RN  Safety Promotion/Fall Prevention:   fall prevention program maintained   nonskid shoes/slippers when out of bed   safety round/check completed  Intervention: Prevent Skin Injury  Recent Flowsheet Documentation  Taken 02/27/2023 0845 by Eileen Stanford, RN  Skin Protection: adhesive use limited  Intervention: Prevent and Manage VTE (Venous Thromboembolism) Risk  Recent Flowsheet Documentation  Taken 02/27/2023 0845 by Eileen Stanford, RN  VTE Prevention/Management: ambulation promoted  Intervention: Prevent Infection  Recent Flowsheet Documentation  Taken 02/27/2023 0845 by Eileen Stanford, RN  Infection Prevention:   promote handwashing   rest/sleep promoted  Goal: Optimal Comfort and Wellbeing  Outcome: Ongoing (see interventions/notes)  Intervention: Provide Person-Centered Care  Recent Flowsheet Documentation  Taken 02/27/2023 0845 by Eileen Stanford, RN  Trust Relationship/Rapport:   care explained   choices provided   emotional support provided   questions answered   empathic listening provided   questions encouraged   reassurance provided   thoughts/feelings acknowledged  Goal: Rounds/Family Conference  Outcome: Ongoing (see interventions/notes)     Problem: Postpartum (Vaginal Delivery)  Goal: Successful Maternal Role Transition  Outcome: Ongoing (see interventions/notes)  Intervention:  Support Maternal Role Transition  Recent Flowsheet Documentation  Taken 02/27/2023 0845 by Eileen Stanford, RN  Supportive Measures:   active listening utilized   relaxation techniques promoted   self-care encouraged   verbalization of feelings encouraged  Goal: Hemostasis  Outcome: Ongoing (see interventions/notes)  Goal: Absence of Infection Signs and Symptoms  Outcome: Ongoing (see interventions/notes)  Goal: Anesthesia/Sedation Recovery  Outcome: Ongoing (see interventions/notes)  Intervention: Optimize Anesthesia Recovery  Recent Flowsheet Documentation  Taken 02/27/2023 0845 by Eileen Stanford, RN  Safety Promotion/Fall Prevention:   fall prevention program maintained   nonskid shoes/slippers when out of bed   safety round/check completed  Goal: Optimal Pain Control and Function  Outcome: Ongoing (see interventions/notes)  Goal: Effective Urinary Elimination  Outcome: Ongoing (see interventions/notes)

## 2023-02-27 NOTE — Care Management Notes (Signed)
Trigger received for positive urine drug screen. CPS referral made. Intake referral number is [id number].

## 2023-02-27 NOTE — Respiratory Therapy (Signed)
In to see patient and spoke about tobacco cessation.  I talked with her about 800-quit now and smokefreewoman.   She denied needing a nicotine patch at this time and stated she plans to make her home a smoke free zone once she is home with the baby.  Gave her my pamphlet and advised her I would love to work with her if she needs help after going home.

## 2023-02-27 NOTE — Progress Notes (Signed)
[facility]  DISCHARGE SUMMARY - OBSTETRICS      PATIENT NAME:  Joanna Hawkins, Joanna Hawkins  MRN:  [id number]  DOB:  12/01/1990    ENCOUNTER START DATE:  02/25/2023  INPATIENT ADMISSION DATE: 02/25/2023  DISCHARGE DATE:  02/27/2023    ATTENDING PHYSICIAN: Budd Palmer,*  PRIMARY CARE PHYSICIAN: No Pcp     ADMISSION DIAGNOSIS: Onset of labor    WEEKS GESTATION ON ADMISSION:: [redacted]w[redacted]d    DISCHARGE DIAGNOSIS: Term pregnancy - delivered    DISCHARGE MEDICATIONS:     Current Discharge Medication List        CONTINUE these medications - NO CHANGES were made during your visit.        Details   buprenorphine-naloxone 8-2 mg Film  Commonly known as: SUBOXONE   1 Film, Sublingual, DAILY, PER PT, She TAKES 1/2 FILM IN MORNING AND 1/2 FILM IN EVENING.   Refills: 0              DISCHARGE INSTRUCTIONS:   No discharge procedures on file.      SIGNIFICANT LAB:     Lab Results   Component Value Date    WBC 15.7 (H) 02/25/2023    HGB 11.9 02/25/2023    HCT 34.3 02/25/2023    PLTCNT 260 02/25/2023         Procedure:  spontaneous vaginal     Anesthesia:  none    Postpartum Complications: None    Edinburgh PP Depression score upon admission:       Bernard Donahoo, Girl [[id number]]      Delivery Information    Birth date/time: 02/25/2023 0421  Sex: Female  Delivery type: Vaginal, Spontaneous  Complications: Fetal Intolerance       Newborn Measurements    Weight: 2880 g       Newborn  Apgars    Living status: Living      Skin color:    Heart rate:    Reflex Irrit:    Muscle tone:    Resp. effort:    Total:     1 Min:    1    2    2    2    1    8     5  Min:    1    2    2    2    1    8     10  Min:     15 Min:     20 Min:       Apgars assigned by: Candie Echevaria RN              COURSE IN HOSPITAL:  32 year old female admitted in active labor.  She was 9 cm at time of admission and delivered precipitously.  Postpartum course uncomplicated.    CONDITION ON DISCHARGE: Alert, Oriented, and VS Stable    DISCHARGE DISPOSITION:  Home  discharge     Kela Millin, DO 02/27/2023 12:27

## 2023-03-03 ENCOUNTER — Ambulatory Visit (HOSPITAL_COMMUNITY): Payer: Self-pay

## 2023-03-03 NOTE — Discharge Summary (Signed)
[facility]  DISCHARGE SUMMARY - OBSTETRICS      PATIENT NAME:  Joanna Hawkins, Joanna Hawkins  MRN:  [id number]  DOB:  [birth year]    ENCOUNTER START DATE:  02/25/2023  INPATIENT ADMISSION DATE: 02/25/2023  DISCHARGE DATE:  02/27/2023    ATTENDING PHYSICIAN: Raynelle Highland Marcellius Montagna, DO  PRIMARY CARE PHYSICIAN: No Pcp     ADMISSION DIAGNOSIS: Onset of labor    WEEKS GESTATION ON ADMISSION:: [redacted]w[redacted]d    DISCHARGE DIAGNOSIS: Term pregnancy - delivered    DISCHARGE MEDICATIONS:     Current Discharge Medication List        START taking these medications.        Details   acetaminophen 500 mg Tablet  Commonly known as: TYLENOL   1,000 mg, Oral, EVERY 6 HOURS PRN  Refills: 0     Ibuprofen 600 mg Tablet  Commonly known as: MOTRIN   600 mg, Oral, EVERY 6 HOURS  Refills: 0            CONTINUE these medications - NO CHANGES were made during your visit.        Details   buprenorphine-naloxone 8-2 mg Film  Commonly known as: SUBOXONE   1 Film, Sublingual, DAILY, PER PT, She TAKES 1/2 FILM IN MORNING AND 1/2 FILM IN EVENING.   Refills: 0              DISCHARGE INSTRUCTIONS:      DISCHARGE INSTRUCTION - ACTIVITY    LIGHT ACTIVITY, NO LIFTING, PELVIC REST, NO BATHS, NO SWIMMING, NO DRIVING FOR 2 WEEKS; PELVIC REST--NOTHING VAGINALLY UNTIL CLEARED BY THE PHYSICIAN     Activity: AS TOLERATED      DISCHARGE INSTRUCTION - DIET - RESUME HOME DIET     Diet: RESUME HOME DIET      DISCHARGE INSTRUCTION - INCISION/WOUND CARE     Instructions for incision/wound care: Clean Incision with Soap and Water    Instructions for incision/wound care: Do NOT Remove Steri Strips, They will fall off on thier own    Instructions for incision/wound care: Keep Incision Clean and Dry    Instructions for incision/wound care: May Shower, NO Tub Baths or Swimming      DISCHARGE INSTRUCTION - PERI CARE WITH STITCHES    Peri-care: with stitches, use peribottle with clean, warm water each time you use the bathroom x 1 week.     DISCHARGE INSTRUCTION - IF NOT  BREASTFEEDING    Tight bra or ice pack for breast engorgement.      Follow-up Information       Budd Palmer, DO Follow up.    Specialty: OBSTETRICS-GYNECOLOGY  Why: Please call and make follow-up appointment in 6 weeks.  Contact information:  [address]  [address]  [place] [address]  [phone]                             SIGNIFICANT LAB:     Lab Results   Component Value Date    WBC 15.7 (H) 02/25/2023    HGB 11.9 02/25/2023    HCT 34.3 02/25/2023    PLTCNT 260 02/25/2023         Procedure:  spontaneous vaginal     Anesthesia:  none    Postpartum Complications: None    Edinburgh PP Depression score upon admission:       Joanna Hawkins, Joanna Hawkins [[id number]]      Delivery  Information    Birth date/time: 02/25/2023 0421  Sex: Female  Delivery type: Vaginal, Spontaneous  Complications: Fetal Intolerance       Newborn Measurements    Weight: 2880 g       Newborn  Apgars    Living status: Living      Skin color:    Heart rate:    Reflex Irrit:    Muscle tone:    Resp. effort:    Total:     1 Min:    1    2    2    2    1    8     5  Min:    1    2    2    2    1    8     10  Min:     15 Min:     20 Min:       Apgars assigned by: Candie Echevaria RN              Feeding Method:  bottle    COURSE IN HOSPITAL: Joanna Hawkins is a 32yo female admitted for term labor. SVD with intact perineum.    CONDITION ON DISCHARGE: Alert, Oriented, and VS Stable    DISCHARGE DISPOSITION:  Home discharge     Budd Palmer, DO 03/03/2023 08:47
